# Patient Record
Sex: Female | Born: 1970 | Race: Black or African American | Hispanic: No | Marital: Single | Smoking: Former smoker
Health system: Southern US, Community
[De-identification: ages and names within clinical notes are randomized; demographics above are authoritative.]

## PROBLEM LIST (undated history)

## (undated) DIAGNOSIS — A4902 Methicillin resistant Staphylococcus aureus infection, unspecified site: Secondary | ICD-10-CM

## (undated) DIAGNOSIS — D571 Sickle-cell disease without crisis: Secondary | ICD-10-CM

## (undated) DIAGNOSIS — I10 Essential (primary) hypertension: Secondary | ICD-10-CM

## (undated) HISTORY — PX: ECTOPIC PREGNANCY SURGERY: SHX613

## (undated) HISTORY — DX: Sickle-cell disease without crisis: D57.1

## (undated) HISTORY — PX: COLONOSCOPY: SHX174

## (undated) HISTORY — PX: OTHER SURGICAL HISTORY: SHX169

## (undated) HISTORY — PX: EYE SURGERY: SHX253

---

## 2006-03-08 ENCOUNTER — Ambulatory Visit (HOSPITAL_COMMUNITY): Admission: RE | Admit: 2006-03-08 | Discharge: 2006-03-08 | Payer: Self-pay | Admitting: Gastroenterology

## 2007-08-23 ENCOUNTER — Emergency Department (HOSPITAL_COMMUNITY): Admission: EM | Admit: 2007-08-23 | Discharge: 2007-08-23 | Payer: Self-pay | Admitting: Emergency Medicine

## 2008-11-10 ENCOUNTER — Emergency Department (HOSPITAL_COMMUNITY): Admission: EM | Admit: 2008-11-10 | Discharge: 2008-11-10 | Payer: Self-pay | Admitting: Emergency Medicine

## 2008-12-22 ENCOUNTER — Emergency Department (HOSPITAL_COMMUNITY): Admission: EM | Admit: 2008-12-22 | Discharge: 2008-12-22 | Payer: Self-pay | Admitting: Emergency Medicine

## 2009-05-18 ENCOUNTER — Emergency Department (HOSPITAL_COMMUNITY): Admission: EM | Admit: 2009-05-18 | Discharge: 2009-05-18 | Payer: Self-pay | Admitting: Emergency Medicine

## 2010-06-09 LAB — URINALYSIS, ROUTINE W REFLEX MICROSCOPIC
Glucose, UA: NEGATIVE mg/dL
Nitrite: POSITIVE — AB
Protein, ur: NEGATIVE mg/dL
Specific Gravity, Urine: 1.005 (ref 1.005–1.030)
Urobilinogen, UA: 0.2 mg/dL (ref 0.0–1.0)
pH: 6 (ref 5.0–8.0)

## 2010-06-09 LAB — URINE MICROSCOPIC-ADD ON

## 2010-06-09 LAB — PREGNANCY, URINE: Preg Test, Ur: NEGATIVE

## 2010-06-24 LAB — URINALYSIS, ROUTINE W REFLEX MICROSCOPIC
Bilirubin Urine: NEGATIVE
Nitrite: NEGATIVE
Protein, ur: NEGATIVE mg/dL
pH: 6 (ref 5.0–8.0)

## 2010-06-24 LAB — RETICULOCYTES
RBC.: 4.12 MIL/uL (ref 3.87–5.11)
Retic Count, Absolute: 103 10*3/uL (ref 19.0–186.0)
Retic Ct Pct: 2.5 % (ref 0.4–3.1)

## 2010-06-24 LAB — CBC
HCT: 30.3 % — ABNORMAL LOW (ref 36.0–46.0)
Hemoglobin: 10.2 g/dL — ABNORMAL LOW (ref 12.0–15.0)
MCV: 72.7 fL — ABNORMAL LOW (ref 78.0–100.0)
Platelets: 242 10*3/uL (ref 150–400)

## 2010-06-24 LAB — PREGNANCY, URINE: Preg Test, Ur: NEGATIVE

## 2010-06-24 LAB — DIFFERENTIAL
Basophils Absolute: 0 10*3/uL (ref 0.0–0.1)
Basophils Relative: 1 % (ref 0–1)
Eosinophils Relative: 2 % (ref 0–5)
Lymphs Abs: 1.4 10*3/uL (ref 0.7–4.0)
Monocytes Absolute: 0.3 10*3/uL (ref 0.1–1.0)
Monocytes Relative: 4 % (ref 3–12)
Neutro Abs: 5.1 10*3/uL (ref 1.7–7.7)

## 2010-06-24 LAB — POCT CARDIAC MARKERS
Myoglobin, poc: 92.2 ng/mL (ref 12–200)
Troponin i, poc: <0.05 ng/mL (ref 0.00–0.09)

## 2010-06-24 LAB — URINE MICROSCOPIC-ADD ON

## 2010-08-06 NOTE — Op Note (Signed)
[name], [name]                 ACCOUNT NO.:  [id number]   MEDICAL RECORD NO.:  [id number]          PATIENT TYPE:  AMB   LOCATION:  ENDO                         FACILITY:  [facility]   PHYSICIAN:  Danise Edge, M.D.   DATE OF BIRTH:  03/25/1970   DATE OF PROCEDURE:  03/08/2006  DATE OF DISCHARGE:                               OPERATIVE REPORT   PROCEDURE:  Screening colonoscopy.   PROCEDURE INDICATIONS:  [name] is a 39 year old female born  [birth year].  [name] is undergoing diagnostic colonoscopy to  evaluate intermittent painless hematochezia.  She has chronic  hypertension and sickle cell disease.  [name]' sister developed  colon cancer at age 17.  Her mother and brother have undergone  colonoscopic exams to remove colon polyps.   ENDOSCOPIST:  Danise Edge, M.D.   PREMEDICATION:  Fentanyl 100 mcg, Versed 10 mg.   DESCRIPTION OF PROCEDURE:  After obtaining informed consent, Sherry George  was placed in the left lateral decubitus position.  I administered  intravenous fentanyl and intravenous Versed to achieve conscious  sedation for the procedure.  The patient's blood pressure, oxygen  saturation and cardiac rhythm were monitored throughout the procedure  and documented in the medical record.   Anal inspection and digital rectal exam were normal.  The Pentax  colonoscope was introduced into the rectum and advanced to the cecum  without difficulty.  Colonic preparation for the exam today was  satisfactory.   Rectum normal.  Retroflex view of the distal rectum reveals moderate  size non-bleeding internal hemorrhoids.  Sigmoid colon and descending colon normal.  Splenic flexure normal.  Transverse colon normal.  Hepatic flexure normal.  Descending colon normal.  Cecum and ileocecal valve normal.   ASSESSMENT:  Normal proctocolonoscopy to the cecum except for the  presence of moderate sized internal hemorrhoids which are probably the  etiology of Ms.  Sherry George' intermittent painless hematochezia.  There is no  endoscopic evidence of the presence of colorectal neoplasia or  inflammatory bowel disease.   RECOMMENDATIONS:  Repeat screening colonoscopy in December 2012 given  her strong family history of colon cancer and colon polyps.           ______________________________  Danise Edge, M.D.     MJ/MEDQ  D:  03/08/2006  T:  03/08/2006  Job:  [id number]   cc:   Lesle Chris, R.N.

## 2010-12-27 ENCOUNTER — Emergency Department (HOSPITAL_COMMUNITY): Payer: Self-pay

## 2010-12-27 ENCOUNTER — Emergency Department (HOSPITAL_COMMUNITY)
Admission: EM | Admit: 2010-12-27 | Discharge: 2010-12-27 | Disposition: A | Discharge status: Home / Self Care | Payer: Self-pay | Attending: Emergency Medicine | Admitting: Emergency Medicine

## 2010-12-27 DIAGNOSIS — R079 Chest pain, unspecified: Secondary | ICD-10-CM | POA: Insufficient documentation

## 2010-12-27 DIAGNOSIS — F17200 Nicotine dependence, unspecified, uncomplicated: Secondary | ICD-10-CM | POA: Insufficient documentation

## 2010-12-27 DIAGNOSIS — L0291 Cutaneous abscess, unspecified: Secondary | ICD-10-CM | POA: Insufficient documentation

## 2010-12-27 DIAGNOSIS — I10 Essential (primary) hypertension: Secondary | ICD-10-CM | POA: Insufficient documentation

## 2010-12-27 DIAGNOSIS — D571 Sickle-cell disease without crisis: Secondary | ICD-10-CM | POA: Insufficient documentation

## 2010-12-27 LAB — BASIC METABOLIC PANEL
BUN: 11 mg/dL (ref 6–23)
Chloride: 101 mEq/L (ref 96–112)
Creatinine, Ser: 0.69 mg/dL (ref 0.50–1.10)
Glucose, Bld: 97 mg/dL (ref 70–99)
Potassium: 3.9 mEq/L (ref 3.5–5.1)

## 2010-12-27 LAB — CBC
HCT: 33.1 % — ABNORMAL LOW (ref 36.0–46.0)
MCV: 74.7 fL — ABNORMAL LOW (ref 78.0–100.0)
Platelets: 180 10*3/uL (ref 150–400)
RBC: 4.43 MIL/uL (ref 3.87–5.11)

## 2010-12-27 LAB — DIFFERENTIAL
Eosinophils Absolute: 0.2 10*3/uL (ref 0.0–0.7)
Lymphs Abs: 1.8 10*3/uL (ref 0.7–4.0)
Monocytes Absolute: 0.4 10*3/uL (ref 0.1–1.0)
Monocytes Relative: 6 % (ref 3–12)
Neutrophils Relative %: 68 % (ref 43–77)

## 2010-12-27 MED ORDER — IOHEXOL 300 MG/ML  SOLN
100.0000 mL | Freq: Once | INTRAMUSCULAR | Status: AC | PRN
  Administered 2010-12-27: 100 mL via INTRAVENOUS

## 2011-01-03 ENCOUNTER — Inpatient Hospital Stay (HOSPITAL_COMMUNITY)
Admission: EM | Admit: 2011-01-03 | Discharge: 2011-01-11 | DRG: 602 | Disposition: A | Discharge status: Home / Self Care | Payer: Self-pay | Attending: Internal Medicine | Admitting: Internal Medicine

## 2011-01-03 DIAGNOSIS — T370X5A Adverse effect of sulfonamides, initial encounter: Secondary | ICD-10-CM | POA: Diagnosis present

## 2011-01-03 DIAGNOSIS — B957 Other staphylococcus as the cause of diseases classified elsewhere: Secondary | ICD-10-CM | POA: Diagnosis present

## 2011-01-03 DIAGNOSIS — F17200 Nicotine dependence, unspecified, uncomplicated: Secondary | ICD-10-CM | POA: Diagnosis present

## 2011-01-03 DIAGNOSIS — R799 Abnormal finding of blood chemistry, unspecified: Secondary | ICD-10-CM | POA: Diagnosis present

## 2011-01-03 DIAGNOSIS — D72829 Elevated white blood cell count, unspecified: Secondary | ICD-10-CM | POA: Diagnosis not present

## 2011-01-03 DIAGNOSIS — E871 Hypo-osmolality and hyponatremia: Secondary | ICD-10-CM | POA: Diagnosis present

## 2011-01-03 DIAGNOSIS — R5081 Fever presenting with conditions classified elsewhere: Secondary | ICD-10-CM | POA: Diagnosis present

## 2011-01-03 DIAGNOSIS — I10 Essential (primary) hypertension: Secondary | ICD-10-CM | POA: Diagnosis present

## 2011-01-03 DIAGNOSIS — J329 Chronic sinusitis, unspecified: Secondary | ICD-10-CM | POA: Diagnosis present

## 2011-01-03 DIAGNOSIS — R0789 Other chest pain: Secondary | ICD-10-CM | POA: Diagnosis present

## 2011-01-03 DIAGNOSIS — D5700 Hb-SS disease with crisis, unspecified: Secondary | ICD-10-CM | POA: Diagnosis present

## 2011-01-03 DIAGNOSIS — E876 Hypokalemia: Secondary | ICD-10-CM | POA: Diagnosis not present

## 2011-01-03 DIAGNOSIS — L02219 Cutaneous abscess of trunk, unspecified: Principal | ICD-10-CM | POA: Diagnosis present

## 2011-01-03 DIAGNOSIS — D6959 Other secondary thrombocytopenia: Secondary | ICD-10-CM | POA: Diagnosis present

## 2011-01-03 DIAGNOSIS — J189 Pneumonia, unspecified organism: Secondary | ICD-10-CM | POA: Diagnosis present

## 2011-01-03 LAB — COMPREHENSIVE METABOLIC PANEL
ALT: 21 U/L (ref 0–35)
AST: 72 U/L — ABNORMAL HIGH (ref 0–37)
Alkaline Phosphatase: 54 U/L (ref 39–117)
Calcium: 8.2 mg/dL — ABNORMAL LOW (ref 8.4–10.5)
Glucose, Bld: 102 mg/dL — ABNORMAL HIGH (ref 70–99)
Potassium: 3.4 mEq/L — ABNORMAL LOW (ref 3.5–5.1)
Sodium: 128 mEq/L — ABNORMAL LOW (ref 135–145)
Total Protein: 6.9 g/dL (ref 6.0–8.3)

## 2011-01-03 LAB — BASIC METABOLIC PANEL
BUN: 9 mg/dL (ref 6–23)
Calcium: 8.5 mg/dL (ref 8.4–10.5)
Chloride: 96 mEq/L (ref 96–112)
GFR calc Af Amer: 86 mL/min — ABNORMAL LOW (ref >90)
GFR calc non Af Amer: 74 mL/min — ABNORMAL LOW (ref >90)
Glucose, Bld: 93 mg/dL (ref 70–99)
Potassium: 3.8 mEq/L (ref 3.5–5.1)

## 2011-01-03 LAB — DIFFERENTIAL
Basophils Relative: 0 % (ref 0–1)
Eosinophils Absolute: 0.4 10*3/uL (ref 0.0–0.7)
Monocytes Relative: 4 % (ref 3–12)

## 2011-01-03 LAB — CBC
HCT: 27.9 % — ABNORMAL LOW (ref 36.0–46.0)
RBC: 3.77 MIL/uL — ABNORMAL LOW (ref 3.87–5.11)
RDW: 17.8 % — ABNORMAL HIGH (ref 11.5–15.5)
WBC: 4.8 10*3/uL (ref 4.0–10.5)

## 2011-01-04 ENCOUNTER — Inpatient Hospital Stay (HOSPITAL_COMMUNITY): Payer: Self-pay

## 2011-01-04 LAB — COMPREHENSIVE METABOLIC PANEL
ALT: 24 U/L (ref 0–35)
Alkaline Phosphatase: 56 U/L (ref 39–117)
CO2: 21 mEq/L (ref 19–32)
Chloride: 100 mEq/L (ref 96–112)
GFR calc Af Amer: >90 mL/min (ref >90)
Glucose, Bld: 97 mg/dL (ref 70–99)
Potassium: 4 mEq/L (ref 3.5–5.1)
Sodium: 133 mEq/L — ABNORMAL LOW (ref 135–145)
Total Bilirubin: 0.7 mg/dL (ref 0.3–1.2)
Total Protein: 7.2 g/dL (ref 6.0–8.3)

## 2011-01-04 LAB — CBC
HCT: 22.8 % — ABNORMAL LOW (ref 36.0–46.0)
Hemoglobin: 8.1 g/dL — ABNORMAL LOW (ref 12.0–15.0)
MCH: 26.7 pg (ref 26.0–34.0)
MCHC: 35.5 g/dL (ref 30.0–36.0)
MCV: 75.2 fL — ABNORMAL LOW (ref 78.0–100.0)
RDW: 17.7 % — ABNORMAL HIGH (ref 11.5–15.5)

## 2011-01-04 LAB — BASIC METABOLIC PANEL
BUN: 9 mg/dL (ref 6–23)
Calcium: 8.5 mg/dL (ref 8.4–10.5)
Creatinine, Ser: 0.82 mg/dL (ref 0.50–1.10)
GFR calc Af Amer: >90 mL/min (ref >90)
GFR calc non Af Amer: 88 mL/min — ABNORMAL LOW (ref >90)
Glucose, Bld: 91 mg/dL (ref 70–99)

## 2011-01-04 MED ORDER — IOHEXOL 300 MG/ML  SOLN
100.0000 mL | Freq: Once | INTRAMUSCULAR | Status: AC | PRN
  Administered 2011-01-04: 100 mL via INTRAVENOUS

## 2011-01-05 LAB — URINALYSIS, ROUTINE W REFLEX MICROSCOPIC
Glucose, UA: NEGATIVE mg/dL
Ketones, ur: NEGATIVE mg/dL
Leukocytes, UA: NEGATIVE
pH: 6 (ref 5.0–8.0)

## 2011-01-05 LAB — URINE MICROSCOPIC-ADD ON

## 2011-01-05 LAB — CBC
MCH: 27 pg (ref 26.0–34.0)
MCHC: 36 g/dL (ref 30.0–36.0)
Platelets: 61 10*3/uL — ABNORMAL LOW (ref 150–400)

## 2011-01-06 DIAGNOSIS — D5700 Hb-SS disease with crisis, unspecified: Secondary | ICD-10-CM

## 2011-01-06 DIAGNOSIS — R5081 Fever presenting with conditions classified elsewhere: Secondary | ICD-10-CM

## 2011-01-06 LAB — DIFFERENTIAL
Eosinophils Absolute: 0 10*3/uL (ref 0.0–0.7)
Eosinophils Relative: 0 % (ref 0–5)
Metamyelocytes Relative: 5 %
Monocytes Absolute: 1.4 10*3/uL — ABNORMAL HIGH (ref 0.1–1.0)
Monocytes Relative: 6 % (ref 3–12)
nRBC: 0 /100 WBC

## 2011-01-06 LAB — CBC
HCT: 19.1 % — ABNORMAL LOW (ref 36.0–46.0)
Hemoglobin: 7.4 g/dL — ABNORMAL LOW (ref 12.0–15.0)
Hemoglobin: 6.7 g/dL — CL (ref 12.0–15.0)
MCH: 26.3 pg (ref 26.0–34.0)
MCH: 26.8 pg (ref 26.0–34.0)
MCHC: 35.1 g/dL (ref 30.0–36.0)
MCV: 76.9 fL — ABNORMAL LOW (ref 78.0–100.0)
MCV: 76.4 fL — ABNORMAL LOW (ref 78.0–100.0)
RBC: 2.81 MIL/uL — ABNORMAL LOW (ref 3.87–5.11)

## 2011-01-06 LAB — COMPREHENSIVE METABOLIC PANEL
AST: 96 U/L — ABNORMAL HIGH (ref 0–37)
Albumin: 2.9 g/dL — ABNORMAL LOW (ref 3.5–5.2)
Alkaline Phosphatase: 43 U/L (ref 39–117)
Chloride: 102 mEq/L (ref 96–112)
Potassium: 3.5 mEq/L (ref 3.5–5.1)
Total Bilirubin: 1.1 mg/dL (ref 0.3–1.2)

## 2011-01-06 LAB — BASIC METABOLIC PANEL
CO2: 25 mEq/L (ref 19–32)
Chloride: 99 mEq/L (ref 96–112)
GFR calc non Af Amer: 78 mL/min — ABNORMAL LOW (ref >90)
Glucose, Bld: 120 mg/dL — ABNORMAL HIGH (ref 70–99)
Potassium: 3.6 mEq/L (ref 3.5–5.1)
Sodium: 132 mEq/L — ABNORMAL LOW (ref 135–145)

## 2011-01-06 LAB — CMV IGM: CMV IgM: 0.26 (ref <0.90)

## 2011-01-06 LAB — WOUND CULTURE

## 2011-01-07 LAB — PATHOLOGIST SMEAR REVIEW

## 2011-01-07 LAB — HEPARIN INDUCED THROMBOCYTOPENIA PNL
Heparin Induced Plt Ab: NEGATIVE
Patient O.D.: 0.324
UFH Low Dose 0.1 IU/mL: 0 % Release
UFH SRA Result: NEGATIVE

## 2011-01-07 LAB — DIFFERENTIAL
Basophils Relative: 0 % (ref 0–1)
Eosinophils Relative: 1 % (ref 0–5)
Lymphs Abs: 2.4 10*3/uL (ref 0.7–4.0)
Metamyelocytes Relative: 0 %
Monocytes Absolute: 0.7 10*3/uL (ref 0.1–1.0)
Myelocytes: 0 %
Neutro Abs: 15 10*3/uL — ABNORMAL HIGH (ref 1.7–7.7)
Neutrophils Relative %: 82 % — ABNORMAL HIGH (ref 43–77)

## 2011-01-07 LAB — CBC
MCHC: 34.9 g/dL (ref 30.0–36.0)
MCV: 77.4 fL — ABNORMAL LOW (ref 78.0–100.0)
Platelets: 106 10*3/uL — ABNORMAL LOW (ref 150–400)
RDW: 19.8 % — ABNORMAL HIGH (ref 11.5–15.5)
WBC: 18.3 10*3/uL — ABNORMAL HIGH (ref 4.0–10.5)

## 2011-01-07 LAB — RETICULOCYTES: Retic Count, Absolute: 272.2 10*3/uL — ABNORMAL HIGH (ref 19.0–186.0)

## 2011-01-07 LAB — PARVOVIRUS B19 ANTIBODY, IGG AND IGM: Parovirus B19 IgG Abs: 5.1 Index — ABNORMAL HIGH (ref <0.9)

## 2011-01-08 ENCOUNTER — Inpatient Hospital Stay (HOSPITAL_COMMUNITY): Payer: Self-pay

## 2011-01-08 LAB — COMPREHENSIVE METABOLIC PANEL
ALT: 13 U/L (ref 0–35)
Alkaline Phosphatase: 47 U/L (ref 39–117)
BUN: 5 mg/dL — ABNORMAL LOW (ref 6–23)
CO2: 26 mEq/L (ref 19–32)
Chloride: 102 mEq/L (ref 96–112)
GFR calc Af Amer: 82 mL/min — ABNORMAL LOW (ref >90)
Glucose, Bld: 105 mg/dL — ABNORMAL HIGH (ref 70–99)
Potassium: 3.3 mEq/L — ABNORMAL LOW (ref 3.5–5.1)
Sodium: 136 mEq/L (ref 135–145)
Total Bilirubin: 1.4 mg/dL — ABNORMAL HIGH (ref 0.3–1.2)

## 2011-01-08 LAB — HAPTOGLOBIN: Haptoglobin: 8 mg/dL — ABNORMAL LOW (ref 30–200)

## 2011-01-08 LAB — TYPE AND SCREEN: Antibody Screen: NEGATIVE

## 2011-01-08 LAB — CBC
HCT: 21.6 % — ABNORMAL LOW (ref 36.0–46.0)
Hemoglobin: 7.2 g/dL — ABNORMAL LOW (ref 12.0–15.0)
RBC: 2.71 MIL/uL — ABNORMAL LOW (ref 3.87–5.11)
WBC: 20.3 10*3/uL — ABNORMAL HIGH (ref 4.0–10.5)

## 2011-01-08 NOTE — Consult Note (Signed)
Sherry George, Sherry George NO.:  [id number]  MEDICAL RECORD NO.:  [id number]  LOCATION:  1341                         FACILITY:  [facility]  PHYSICIAN:  Judyann Munson, MD     DATE OF BIRTH:  [birth year]  DATE OF CONSULTATION: DATE OF DISCHARGE:                                CONSULTATION   REASON FOR CONSULTATION:  Fever and elevated white count despite broad- spectrum antibiotics.  HISTORY OF PRESENT ILLNESS:  Sherry George is a 40 year old African American female that has history of sickle cell disease.  She states that she has had 2 concurrent problems of chest pressure as well as had a chronic cellulitis to her left groin for roughly 3 weeks.  Her sickle cell disease has been relatively under good control, has not had any recent hospitalization.    She originally presented to the emergency room on October 4,  complaining of right axillary pain and flank pain.  At that time, her workup was benign and she was diagnosed with a sickle cell disease exacerbation and was sent home.  No antibiotics were given at that time.  Her chest x-ray did not show any infiltrates.  She returned back to the [facility] October 8th with history of 2 days of chest pressure. No productive cough.  No fevers at that time, but also still complained of some weeping of serous fluid of her left groin, possibly from an ingrown hair, for which she has been using topical antibiotic cream.  At that time, she did not have any leukocytosis nor fever.  They did repeat a chest x-ray at that time that did not show any infiltrates. she underwent a chest CT, which was negative for any pulmonary emboli.  She was given a 7 day course of Bactrim at that time to treat cellulitis.  2 days into her antibiotic course, she subsequently started noticing having nausea and vomiting associated with taking her antibiotic. She was feeling poorly and started having high fevers.  She presented to the emergency room again on  October 15th and at that time, she was noted to have a fever as high as 103.  She did have chills, nausea, and vomiting, and states that she had been taking her antibiotics as directed, only having 1 tablet left.  On admit,  she was not noted to have any rash, did have some flank pain, and has underwent an abdominal CT.  Her chest x-ray on October 16th within 24 hours of her admit, it is now showing left lower lobe airspace disease as well as retrocardiac infiltrate.  It is important to note that her white count is now 4.8, hematocrit of 27.9, hemoglobin of 10, which is decreased from a week prior where her hemoglobin was 11.7 and hematocrit of 33.1. In addition, her platelets at baseline were 180 and now dropped down to 66.  She was placed on vancomycin for cellulitis and then subsequently was placed on imipenem due to the finding infiltrates on her imaging concerning for pneumonia.  Since being admitted, her white count has been trending up quickly on October 16, it went from 5 on admit, increased to 19.6 on the 17th and  on the 18th up to 24.  No differential has been done.  Her hemoglobin has been trending down originally at 10, down to 7.4, hematocrit of 27.9, down to 21.  Her platelets have steadily increased up to 100.  She still has been having fevers of 101, however, has been taking Tylenol, so unclear her fever curve.  When asking the patient what she thinks is going on, she feels that this is all tied to the Bactrim where she felt that when she started taking the antibiotics within 2 days and got significantly worse.  She states that since being admitted, the cough medicine has been helping her somewhat.  The patient is not known to have any other illnesses other than hypertension associated with her sickle cell disease.  She does take folic acid and iron occasionally, otherwise has been in good fair of health.  Her infectious workup has not shown any bacteremia, has superficial  wound culture which showed a few coag-negative staphylococcus.  Her imaging is significant for patchy infiltrates on chest x-ray in the left lower lobe as well as retrocardiac space.  PAST MEDICAL HISTORY: 1. Sickle cell disease. 2. Hypertension. 3. History of tubal pregnancy, ectopic pregnancy, status post right     fallopian tube removal.  ALLERGIES:  MORPHINE and AMOXICILLIN causes yeast infections, and i would list  BACTRIM as an allergy for the patient since it could be causing BM suppression +/- hemolytic anemia  MEDICATION: 1. Vancomycin. 2. Imipenem. 3. Tylenol p.r.n. 4. Robitussin.  SOCIAL HISTORY:  Patient is single, is a single parent to 1 daughter. She does have a partner that she lives with and he is present during this interview.  She is currently unemployed.  Does smoke about 6 cigarettes per day and does drink socially.  No illicit drug use.  FAMILY HISTORY:  Significant for coronary artery disease, heart disease, sickle cell disease, diabetes, and hypertension.  REVIEW OF SYSTEMS:  She describes to have feeling poorly and having fevers and chills.  No night sweats.  Does have chest pressure with occasional cough, nonproductive.  No wheezes.  No chest pain or palpitations.  No acid reflux.  Abdominal distention.  She has right bilateral low back pain.  No flank pain.  No dysuria.  She does have tenderness in her left groin where has area of cellulitis and then no arthralgias or myalgias.  No headache or difficulty with vision.  12 point review of systems is otherwise negative.  PHYSICAL EXAMINATION:  VITAL SIGNS:  Her T-max is 101.6, currently at 98.3, pulse of 84, blood pressure 117/78, respiration rate 24, and 97% on room air. GENERAL:  This is a slightly ill-appearing female.  She is on the edge of the bed leaning over a trash can, feeling nauseous, but she is able to answer questions appropriately. HEENT:  Normocephalic, atraumatic.  PERRLA, EOMI.  No  scleral icterus. Pale conjunctivae.  Oropharynx is clear.  No signs of thrush or erythema. NECK:  Supple.  No lymphadenopathy.  No JVD.  No thyromegaly. PULMONARY EXAM:  Clear to auscultation bilaterally.  No wheezes, crackles, or rhonchi.  No egophony.  No accessory muscle use.  CARDIAC: Normal S1, S2.  No gallops, murmurs, or rubs. ABDOMEN:  Positive bowel sounds.  Nontender and nondistended.  No hepatosplenomegaly.  No flank pain.  Does have mild tenderness to deep palpation at the SI joints. EXTREMITIES:  Trace edema.  No clubbing or cyanosis. NEUROLOGIC:  Cranial nerves II through XII are grossly intact.  5/5 motor, sensation is intact.  LABORATORY DATA:  Her white count is 24, hemoglobin is 7.4, hematocrit 21.6, platelets of 101.  This is down from her counts back on the 8th, which were white count of 7.6, hemoglobin of 11.7, hematocrit of 32.1, and platelets of 180.  Her basic metabolic panel shows a creatinine of 0.9, BUN of 6.  C diff is negative.  She does have a positive fecal lactoferrin.  UA shows a large amount of blood, negative nitrites, negative leuk esterase.  LDH is 1209.  Her AST is elevated at 175 and ALT of 24.  HIV is negative.  MICROBIOLOGY:   1)  2 sets of blood cultures on October 15, no growth to date.   2) Blood culture on October 16, no growth to date. 3) Wound culture on October 15, shows a few coag-negative staph.  RADIOLOGY: 1. Chest x-ray on October 16 shows consolidation of left lower lobe,     best seen on the CT abdomen and pelvis, atelectasis or infiltrate     in the right lung base as well.  No effusion. 2. Abdominal and pelvis CT shows multifocal infiltrates in the lung     base, mostly in the left lower lobe.  The spleen is enlarged,     length of 17 cm.  Large and small bowel are unremarkable.  No free     fluid in the pelvis. 3. Old imaging.  Chest CT on October 8 shows no pulmonary emboli, no     acute findings.  Chest x-ray on October 8  shows stable chronic     bilateral atelectasis, no acute poor cardiopulmonary process.  ASSESSMENT AND PLAN:  This is a 40 year old female with sickle cell disease and has had chronic weeping of the left groin lesion/cellulitis for which she has been using topical antibiotics, presented initially back on October 4 with mild sickle cell exacerbation and returned on October 8 for worsening constitutional symptoms.  She had worsening of chest pain as well as concern for ongoing cellulitis.  She was given a 7 day course of Bactrim, for which she now is admitted with having high fevers, new thrombocytopenia and chest x-rays with air space disease. She was started on vancomycin and imipenem for cellulitis, and presumed pneumonia, and still remains to be febrile, increasing leukocytosis despite antibiotics, worsening anemia and evidence of hemolytic anemia by elevated LDH.  This is likely a combination of the patient having acute sickle crisis with acute chest syndrome.  The trigger to her sickle crisis is not completely clear but it is likely coIt has compounded by having hemolytic anemia and at the very least BM suppression related to Bactrim exposure.  She is not previously diagnosed as  having a G6PD deficiency, however, this may explain that Bactrim is triggering her hemolytic anemia versus possibly a viral illness triggering this episode.  She has been having erratically high fevers of 101.6-103 and with leukocytosis, which can also be explained as a drug fever related recent exposure to Bactrim. Her fever curve is somewhat trending down, however, it is difficult to tell given her Tylenol exposure.  Overall, I feel her process is sequelae of her sickle cell crisis and less likely infectious.  RECOMMENDATION: 1. For sickle cell crisis, we will ask to check for G6PD deficiency     workup, hemolysis labs and peripheral blood smears to ensure     that this is consistent with a sickle cell  crisis and can compare  to her blood specimens from a few days ago, since it appears that     her H and H continues to trend down. can restart home doses of folic      acid and iron sulfate since patient would not like to take any blood      products at this time. 2. For the mild cellulitis, recommend to stop vancomycin today since     the patient has roughly finished a 10-day course of therapy if you     include the 7 days of Bactrim she had received prior to being     admitted.  She is not MRSA colonized and nor was there any MRSA     isolated in her wound culture and thus I would not need at this     antibiotic and on physical exam, erythema is improved.  This has a     small amount of serous drainage. 3. Chest infiltrates.  This is likely an acute chest syndrome in a     sickle cell patient with an exacerbation likely triggered by the     recent events and exposure to Bactrim vs. viral illness.  The patient      could be having a secondary process of a superimposed pneumonia, where     mycoplasma is thought to be more common. we would recommend to change her     regimen to switch to CAP coverage to inc. ceftriaxone or cefuroxime plus azithromycin;     this is not likely to be HCAP since the patient has not had any frequent     hospitalizations over the past 2 years. 4. In regard to her amoxicillin allergy, the patient states that she     is intolerant because she gets yeast infections. 5. Drug fever vs. presumed G6PD deficiency: although it is unclear if onewould see this amount of     leukocytosis associated with a G6PD deficiency exacerbation.  I     felt that this probably could be explained by her Bactrim exposure     that is leading to having a drug fever of high fevers, a component of BM suppression initially on admit,     leukocytosis, and subsequently causing her hemolytic anemia.     Follow up with the G6PD deficiency workup. Her CBC is improving since the discontinuation  of bactrim.  It has been a pleasure to see [name].    Greater than 45 minutes has been spent interviewing the patient, speaking with radiology and reviewing her old records.          ______________________________ Judyann Munson, MD    CS/MEDQ  D:  01/07/2011  T:  01/07/2011  Job:  [id number]  Electronically Signed by Judyann Munson MD on 01/08/2011 01:27:58 AM

## 2011-01-09 DIAGNOSIS — R072 Precordial pain: Secondary | ICD-10-CM

## 2011-01-09 LAB — BASIC METABOLIC PANEL
BUN: 5 mg/dL — ABNORMAL LOW (ref 6–23)
CO2: 27 mEq/L (ref 19–32)
Calcium: 8.5 mg/dL (ref 8.4–10.5)
Chloride: 103 mEq/L (ref 96–112)
Creatinine, Ser: 0.92 mg/dL (ref 0.50–1.10)

## 2011-01-09 LAB — CBC
HCT: 21.9 % — ABNORMAL LOW (ref 36.0–46.0)
MCH: 27 pg (ref 26.0–34.0)
MCV: 79.9 fL (ref 78.0–100.0)
Platelets: 200 10*3/uL (ref 150–400)
RBC: 2.74 MIL/uL — ABNORMAL LOW (ref 3.87–5.11)
RDW: 23.7 % — ABNORMAL HIGH (ref 11.5–15.5)
WBC: 21.8 10*3/uL — ABNORMAL HIGH (ref 4.0–10.5)

## 2011-01-10 ENCOUNTER — Inpatient Hospital Stay (HOSPITAL_COMMUNITY): Payer: Self-pay

## 2011-01-10 LAB — CULTURE, BLOOD (ROUTINE X 2)
Culture  Setup Time: 201210160218
Culture: NO GROWTH

## 2011-01-10 LAB — BASIC METABOLIC PANEL
BUN: 7 mg/dL (ref 6–23)
Calcium: 8.7 mg/dL (ref 8.4–10.5)
Creatinine, Ser: 0.91 mg/dL (ref 0.50–1.10)
GFR calc Af Amer: 90 mL/min — ABNORMAL LOW (ref >90)
GFR calc non Af Amer: 77 mL/min — ABNORMAL LOW (ref >90)

## 2011-01-10 LAB — CBC
HCT: 24.6 % — ABNORMAL LOW (ref 36.0–46.0)
MCHC: 32.5 g/dL (ref 30.0–36.0)
MCV: 82.3 fL (ref 78.0–100.0)
Platelets: 270 10*3/uL (ref 150–400)
RDW: 24.2 % — ABNORMAL HIGH (ref 11.5–15.5)
WBC: 18.1 10*3/uL — ABNORMAL HIGH (ref 4.0–10.5)

## 2011-01-11 ENCOUNTER — Inpatient Hospital Stay (HOSPITAL_COMMUNITY): Payer: Self-pay

## 2011-01-11 LAB — COMPREHENSIVE METABOLIC PANEL
ALT: 11 U/L (ref 0–35)
AST: 25 U/L (ref 0–37)
Albumin: 2.7 g/dL — ABNORMAL LOW (ref 3.5–5.2)
Alkaline Phosphatase: 55 U/L (ref 39–117)
BUN: 6 mg/dL (ref 6–23)
Chloride: 102 mEq/L (ref 96–112)
Potassium: 3.7 mEq/L (ref 3.5–5.1)
Sodium: 138 mEq/L (ref 135–145)
Total Bilirubin: 1.3 mg/dL — ABNORMAL HIGH (ref 0.3–1.2)
Total Protein: 6.3 g/dL (ref 6.0–8.3)

## 2011-01-11 LAB — CBC
MCHC: 33.2 g/dL (ref 30.0–36.0)
Platelets: 260 10*3/uL (ref 150–400)
RDW: 24.5 % — ABNORMAL HIGH (ref 11.5–15.5)
WBC: 15.5 10*3/uL — ABNORMAL HIGH (ref 4.0–10.5)

## 2011-01-11 LAB — HEMOGLOBINOPATHY EVALUATION
Hgb A2 Quant: 3.5 % — ABNORMAL HIGH (ref 2.2–3.2)
Hgb F Quant: 3.4 % — ABNORMAL HIGH (ref 0.0–2.0)
Hgb S Quant: 48.6 % — ABNORMAL HIGH (ref 0.0–0.0)

## 2011-01-11 LAB — GLUCOSE 6 PHOSPHATE DEHYDROGENASE: G-6-PD, Quant: 27.9 U/GM HGB — ABNORMAL HIGH (ref 7.0–20.5)

## 2011-01-14 LAB — HGB ELECTROPHORESIS REFLEXED REPORT
Hemoglobin A2 - HGBRFX: 4.4 % — ABNORMAL HIGH (ref 1.8–3.5)
Hemoglobin F - HGBRFX: 2.6 % — ABNORMAL HIGH (ref <2.0)
Hemoglobin S - HGBRFX: 48.6 % — ABNORMAL HIGH
Sickle Solubility Test - HGBRFX: POSITIVE — AB

## 2011-01-16 NOTE — Discharge Summary (Signed)
Sherry George, Sherry George                 ACCOUNT NO.:  [id number]  MEDICAL RECORD NO.:  [id number]  LOCATION:  1341                         FACILITY:  [facility]  PHYSICIAN:  Kela Millin, M.D.DATE OF BIRTH:  June 23, 1970  DATE OF ADMISSION:  01/03/2011 DATE OF DISCHARGE:  01/11/2011                        DISCHARGE SUMMARY - REFERRING   DISCHARGE DIAGNOSES: 1. Bactrim adverse reaction/allergy - Resolving.  The patient's G6PD     level is 27.9, she is not a G6PD deficient. 2. Pneumonia with associated sickle cell lung disease/? acute chest     syndrome. 3. Thrombocytopenia - Secondary to Bactrim myelosuppression, resolved. 4. Normocytic anemia - Hemoglobin stable today at 8.2 prior to     discharge. 5. Sickle cell disease, hemoglobin [place]. 6. Elevated proBNP - Possibly due to sickle lung disease per Dr. August Saucer,     no evidence of fluid overload. 7. Drug fever - Secondary to Bactrim, resolved. 8. Leukocytosis - Improved,? nucleated cells cueing the count. 9. History of hypertension. 10.Tobacco abuse.  PROCEDURES AND STUDIES: 1. CT scan of abdomen and pelvis on October 16th - Multifocal     pneumonia in the lung bases, most confluent in the left lower lobe. 2. Chest x-ray on October 16th - Left lower lobe pneumonia.  Right     basilar atelectasis or infiltrate. 3. Chest x-ray on October 20th - Unchanged left lower lobe     consolidation/atelectasis.  Pneumonia not excluded. 4. Limited CT scan of the maxillofacial - Normal paranasal sinuses. 5. A 2-D echocardiogram on October 21st:  Ejection fraction 65% to     70%.  Systolic function vigorous.  CONSULTATIONS: 1. Infectious Disease - Dr. Ilsa Iha. 2. [facility] - Dr. August Saucer.  BRIEF HISTORY:  The patient is a pleasant 40 year old black female with a history of Morton disease, who presented with pain and swelling in the left groin as well as fever.  It was noted that she had been seen in the ED previously and was started on Bactrim and  discharged home, but she was unable to tolerate the Bactrim and continued to have the fever and so, she came back to the ED.  Please see the full admission history and physical dictated on January 03, 2011 for the details of the admission physical exam as well as the laboratory data.  HOSPITAL COURSE: 1. Left groin cellulitis - Upon admission, the patient was placed on     broad-spectrum antibiotics with vancomycin and Primaxin.  Blood     cultures were obtained.  The blood cultures did not grew any     bacteria.  The patient continued to spike fever and so Infectious     Disease was consulted and Dr. Ilsa Iha saw the patient and her     impression was that she was having drug fever secondary to the     Bactrim as it usually takes some time to completely get out of her     system.  The patient received the full course of antibiotics for     cellulitis with the vancomycin and it was discontinued.  The     suspicion was also that the patient might have had a G6PD  deficiency and if so, Primaxin would be contraindicated and so, thePrimaxin was discontinued at that time and a G6PD level was done.     With this interventions, the patient defervesced and she has not     had any more drainage or pain in her left groin, and the cellulitis     has resolved and she is remaining afebrile and would not require     any further antibiotics upon discharge. 2. Thrombocytopenia - The patient was noted to be thrombocytopenic on     admission and the Bactrim was discontinued and the impression is     that it was the cause of the thrombocytopenia.  Her platelet count     today prior to discharge is 260.  She did not have any gross     bleeding while in the hospital. 3. Pneumonia with associated sickle lung disease - While in the     hospital, the patient was complaining of cough and pleuritic pain.     She was also having fever with associated leukocytosis.  A CT scan     of her abdomen and pelvis had  shown the lower lungs with findings     consistent with multifocal pneumonia in the lung bases and that it     was most confluent in the left lower lobe.  A chest x-ray was done     and showed findings consistent with left lower lobe pneumonia.  The     patient was started on empiric antibiotics for the pneumonia with     Rocephin and Zithromax.  The patient does have sickle cell and so,     the concern was that this could be acute chest syndrome.  I     consulted Dr. August Saucer with the sickle cell center and he saw the     patient.  Her LDH was elevated at 1505, although her bilirubin was     only slightly elevated at 1.4, and she was noted to be anemic.  Dr.     August Saucer agreed with antibiotic coverage and supportive management of     probable associated sickle lung disease.  With this interventions,     she is significantly improved at this time.  She has been afebrile     for the past 48 hours and her p.o. intake is improved as well as     her cough.  She has completed full antibiotic course of pneumonia     and will not require any further antibiotics upon discharge. 4. Leukocytosis - Upon admission, the patient had a normal white cell     count, but subsequently on antibiotics in the hospital.  The white     cell count increased to a high of 24.1.  Infectious Disease was     consulted and Dr. Chaya Jan impression was that it was likely not     infection, but secondary to stress.  As she has been clinically     improving, her leukocytosis has improved down to 15.5 today.  She     is afebrile and hemodynamically stable.  She will be discharged for     outpatient followup at this time. 5. Elevated pro-brain natriuretic peptide - A 2D echocardiogram was     done to further evaluate and the results as stated above with     vigorous systolic function noted.  Dr. August Saucer followed up on the     patient and his impression was that it was probably due  to sickle     lung disease. 6. Sickle cell disease -  The patient did not have any pain consistent     with the pain crisis during this hospital stay.  Her hemoglobin     dropped to a low of 6.7 during this hospital stay on January 06, 2011, but she refused any blood transfusions.  She was placed on     supplemental iron and folic acid and her hemoglobin improved to 8.2     today.  She had had stool guaiacs done at that time and that came     back positive and the impression was that it was just contaminated.     The myelosuppression from her Bactrim and her sickle cell were     thought to be the etiology of the anemia.  She is to follow up     outpatient with Dr. August Saucer and she states she will do so once she can     have her insurance set up. 7. History of hypertension - The patient is on no medications and her     blood pressures were mostly in the 130s while in the hospital.  She     is to follow up at [facility] for further monitoring of her blood     pressures and for antihypertensive to be started as clinically     appropriate. 8. Tobacco abuse - The patient was counseled to quit.  DISCHARGE MEDICATIONS: 1. Tessalon Perles 100 mg p.o. t.i.d. p.r.n. 2. Iron sulfate 325 mg p.o. daily. 3. Folic acid 1 mg p.o. daily. 4. Robitussin DM 10 cc q.4 h. p.r.n. 5. Claritin 10 mg p.o. daily. 6. Tylenol 650 mg p.o. q.4 h. to 6 h. p.r.n. 7. Diphenhydramine 50 mg p.o. q.h.s. 8. Multivitamins 1 p.o. daily. 9. Neosporin apply to groin area daily as previously.  DISCONTINUED MEDICATIONS:  Bactrim.  SPECIAL INSTRUCTIONS:  Please note that the patient has a BACTRIM allergy.  FOLLOWUP CARE: 1. [facility] - Dr. Andrey Campanile, November 30th at 3 p.m. 2. Dr. August Saucer, the patient to call for appointment.  DISCHARGE CONDITION:  Improved/stable.     Kela Millin, M.D.     ACV/MEDQ  D:  01/11/2011  T:  01/11/2011  Job:  [id number]  cc:   Minerva Areola L. August Saucer, M.D. [address] [place], [place] [address] [phone]  Dr.  Andrey Campanile, [facility].  Electronically Signed by Donnalee Curry M.D. on 01/16/2011 03:28:09 PM

## 2011-01-19 NOTE — H&P (Signed)
Sherry George, Sherry George                 ACCOUNT NO.:  [id number]  MEDICAL RECORD NO.:  [id number]  LOCATION:  WLED                         FACILITY:  [facility]  PHYSICIAN:  Ladell Pier, M.D.   DATE OF BIRTH:  01/20/1971  DATE OF ADMISSION:  01/03/2011 DATE OF DISCHARGE:                             HISTORY & PHYSICAL   CHIEF COMPLAINT:  Pain and swelling in the left groin area with fevers.  HISTORY OF PRESENT ILLNESS:  The patient is a 40 year old African American female, past medical history significant for [place] disease, tobacco use, hypertension.  The patient presented to the emergency room a week ago complaining of chest pain and groin pain.  She had a CT of the chest done that was negative for PE.  She was placed on Bactrim for groin cellulitis and sent home.  She stated that since she has been home, the tenderness and pain in the left groin area has improved, but there is still some drainage.  She could not tolerate the Bactrim any more and she continued to have fever, so she presented back to the emergency room.  She complained of chills, also night sweats.  She states that she has 1 pill of the Bactrim, but she would just could not take that any more and she does not think the groin is totally healed. She does not have any other complaints.  PAST MEDICAL HISTORY:  Significant for hypertension that is diet controlled, and [place] disease.  PAST SURGICAL HISTORY:  Ectopic pregnancy with right tube removal.  FAMILY HISTORY:  Father died at 45 from cardiovascular disease.  Mother 84, she has heart disease, diabetes, hypertension.  SOCIAL HISTORY:  Patient is single with 1 child.  She is unemployed. She drinks socially.  She smokes about 6 cigarettes a day.  MEDICATIONS:  She has been taking the Bactrim Double Strength and Benadryl p.r.n., multivitamin a day, she has been using Neosporin topically, Benadryl 50 at bedtime, Tylenol p.r.n., ibuprofen p.r.n.  ALLERGIES:  To AMOXICILLIN  and MORPHINE.  REVIEW OF SYSTEMS:  Negative, otherwise stated in HPI.  PHYSICAL EXAMINATION:  VITAL SIGNS: Temperature 103.1 max, respirations 18, blood pressure 124/86, pulse ox 100% on room air. GENERAL:  The patient is laying on the stretcher well-nourished African American female. HEENT:  Normocephalic, atraumatic.  Pupils reactive to light.  Throat without erythema. CARDIOVASCULAR:  Regular rate and rhythm. LUNGS:  Were clear bilaterally. ABDOMEN:  Positive bowel sounds. EXTREMITIES:  Without edema.  In the left groin area, she does have mild erythema with some purulent drainage with deep pressing in that groin area she does have minimal drainage of yellow purulent material.  LABORATORIES:  WBC 4.8, hemoglobin 10.1, MCV 74, platelets of 66. Sodium 128, potassium 3.8, chloride 96, CO2 of 22, glucose 93, BUN 9, creatinine 0.94, calcium 8.5.  ASSESSMENT: 1. Cellulitis of the groin. 2. Fever. 3. Hypertension. 4. Hyponatremia. 5. Thrombocytopenia.  PLAN: 1. We will admit the patient to the hospital, put her on vancomycin     for the cellulitis, we will get blood cultures and the ER already     had a sample from the groin area.  We will put Bactroban  ointment     on it and low platelets, this could be secondary to the Bactrim     that she was on for the cellulitis.  Since the platelet count on     the 8th when she was here with 180, so we will monitor that for     now.  If she continues to spike fevers despite the antibiotic     therapy, then maybe a CT of the pelvis to rule out deep abscess or     ortho consult would be warranted.  We will defer to the rounding     physician. Time spent with the patient and doing this admission is approximately 45 minutes.     Ladell Pier, M.D.     [place]/MEDQ  D:  01/03/2011  T:  01/03/2011  Job:  [id number]  Electronically Signed by Ladell Pier M.D. on 01/19/2011 09:55:49 PM

## 2011-02-14 ENCOUNTER — Emergency Department (HOSPITAL_COMMUNITY)
Admission: EM | Admit: 2011-02-14 | Discharge: 2011-02-14 | Disposition: A | Discharge status: Home / Self Care | Payer: Self-pay | Attending: Emergency Medicine | Admitting: Emergency Medicine

## 2011-02-14 ENCOUNTER — Encounter: Payer: Self-pay | Admitting: Emergency Medicine

## 2011-02-14 DIAGNOSIS — M542 Cervicalgia: Secondary | ICD-10-CM | POA: Insufficient documentation

## 2011-02-14 DIAGNOSIS — I10 Essential (primary) hypertension: Secondary | ICD-10-CM | POA: Insufficient documentation

## 2011-02-14 DIAGNOSIS — R42 Dizziness and giddiness: Secondary | ICD-10-CM | POA: Insufficient documentation

## 2011-02-14 DIAGNOSIS — F411 Generalized anxiety disorder: Secondary | ICD-10-CM | POA: Insufficient documentation

## 2011-02-14 DIAGNOSIS — R51 Headache: Secondary | ICD-10-CM | POA: Insufficient documentation

## 2011-02-14 DIAGNOSIS — D571 Sickle-cell disease without crisis: Secondary | ICD-10-CM | POA: Insufficient documentation

## 2011-02-14 HISTORY — DX: Sickle-cell disease without crisis: D57.1

## 2011-02-14 HISTORY — DX: Essential (primary) hypertension: I10

## 2011-02-14 MED ORDER — HYDROCHLOROTHIAZIDE 12.5 MG PO TABS: 12.5000 mg | ORAL_TABLET | Freq: Every day | ORAL | Status: DC

## 2011-02-14 MED ORDER — FLUCONAZOLE 150 MG PO TABS
150.0000 mg | ORAL_TABLET | Freq: Once | ORAL | Status: AC
Start: 2011-02-14 — End: 2011-02-14
  Administered 2011-02-14: 150 mg via ORAL
  Filled 2011-02-14: qty 1

## 2011-02-14 NOTE — ED Provider Notes (Signed)
History     CSN: [id number] Arrival date & time: 02/14/2011  2:39 PM   First MD Initiated Contact with Patient 02/14/11 1456      Chief Complaint  Patient presents with  . Hypertension    Pt reports neck pain, headache and hypertention    (Consider location/radiation/quality/duration/timing/severity/associated sxs/prior treatment) HPI History is obtained from the patient. She states that she was recently hospitalized for sickle cell flare. She states that since then, she has had some intermittent headaches and she's felt as if her blood pressure was elevated. She states that she was on HCTZ at one point in the past, but has not been on it for several years. She took her blood pressure twice this week at the drugstore and it was elevated to 180 systolic.   She states that she has a generalized headache, but denies visual changes, photophobia, phonophobia, nausea, vomiting. Occasionally, she has a pain that goes into her neck with this. She has not had any dizziness or weakness or trouble walking. She has not taken anything to improve the headache. There are no known aggravating factors. She describes the headache as a generalized squeezing.  She has an appointment coming up with [facility] to "get established" for primary care in two weeks.  Past Medical History  Diagnosis Date  . Sickle cell anemia   . Hypertension     Past Surgical History  Procedure Date  . Ectopic pregnancy surgery   . Eye surgery     No family history on file.  History  Substance Use Topics  . Smoking status: Current Everyday Smoker  . Smokeless tobacco: Not on file  . Alcohol Use: 1.2 oz/week    1 Glasses of wine, 1 Cans of beer per week    OB History    Grav Para Term Preterm Abortions TAB SAB Ect Mult Living                  Review of Systems  Constitutional: Negative for fever, chills, diaphoresis and appetite change.  HENT: Positive for neck pain. Negative for ear pain, congestion, sore  throat, facial swelling, rhinorrhea, trouble swallowing and neck stiffness.   Eyes: Negative for photophobia, pain, discharge and visual disturbance.  Respiratory: Negative for shortness of breath and wheezing.   Cardiovascular: Negative for chest pain and palpitations.  Gastrointestinal: Negative for nausea, vomiting and abdominal pain.  Musculoskeletal: Negative for myalgias.  Neurological: Positive for light-headedness and headaches. Negative for dizziness, seizures, facial asymmetry, speech difficulty, weakness and numbness.  Psychiatric/Behavioral: Negative for confusion.    Allergies  Bactrim; Morphine and related; and Penicillins  Home Medications   Current Outpatient Rx  Name Route Sig Dispense Refill  . FERROUS SULFATE 325 (65 FE) MG PO TABS Oral Take 325 mg by mouth daily with breakfast.      . FOLIC ACID 1 MG PO TABS Oral Take 1 mg by mouth daily.        BP 160/99  Pulse 82  Temp(Src) 97.7 F (36.5 C) (Oral)  Resp 16  SpO2 100%  LMP 01/20/2011  Physical Exam  Nursing note and vitals reviewed. Constitutional: She is oriented to person, place, and time. She appears well-developed and well-nourished.  HENT:  Head: Normocephalic and atraumatic.  Right Ear: External ear normal.  Left Ear: External ear normal.  Nose: Nose normal.  Mouth/Throat: Oropharynx is clear and moist.  Eyes: Conjunctivae and EOM are normal. Pupils are equal, round, and reactive to light. Right eye exhibits no  discharge. Left eye exhibits no discharge.  Neck: Normal range of motion. Neck supple.       No carotid bruit  Cardiovascular: Normal rate, regular rhythm and normal heart sounds.   Pulmonary/Chest: Effort normal and breath sounds normal. She exhibits no tenderness.  Abdominal: Soft. There is no tenderness.  Musculoskeletal: Normal range of motion.  Lymphadenopathy:    She has no cervical adenopathy.  Neurological: She is alert and oriented to person, place, and time. No cranial nerve  deficit. She exhibits normal muscle tone. Coordination normal.  Skin: Skin is warm and dry. No rash noted.  Psychiatric: Her mood appears anxious.    ED Course  Procedures (including critical care time)  Labs Reviewed - No data to display No results found.   1. Hypertension       MDM  Patient appears nontoxic today; she does, however, seem quite anxious about her sx. Blood pressure is elevated in the ED to 182/110 on initial vitals. Neuro exam is nonfocal. Suspect these sx are likely stemming from elevated BP. Will start the patient back on her HCTZ; she states she was on 12.5 in the past and tolerated this well. She has an appointment coming up with [facility] in about 2 weeks, and was instructed to keep this appointment. She is instructed to keep track of her blood pressures at home. We discussed reasons to return to the ED. She verbalized understanding and agreed to plan.        Sangeeta Youse, [place] 02/15/11 [phone]

## 2011-02-15 NOTE — ED Provider Notes (Signed)
Medical screening examination/treatment/procedure(s) were performed by non-physician practitioner and as supervising physician I was immediately available for consultation/collaboration.  Doug Sou, MD 02/15/11 [phone]

## 2011-08-02 ENCOUNTER — Encounter (INDEPENDENT_AMBULATORY_CARE_PROVIDER_SITE_OTHER): Payer: Self-pay

## 2011-08-22 ENCOUNTER — Ambulatory Visit (INDEPENDENT_AMBULATORY_CARE_PROVIDER_SITE_OTHER): Payer: Self-pay | Admitting: General Surgery

## 2012-03-05 ENCOUNTER — Ambulatory Visit (INDEPENDENT_AMBULATORY_CARE_PROVIDER_SITE_OTHER): Payer: Self-pay | Admitting: Surgery

## 2012-03-26 ENCOUNTER — Encounter (INDEPENDENT_AMBULATORY_CARE_PROVIDER_SITE_OTHER): Payer: Self-pay | Admitting: Surgery

## 2012-03-26 ENCOUNTER — Ambulatory Visit (INDEPENDENT_AMBULATORY_CARE_PROVIDER_SITE_OTHER): Payer: PRIVATE HEALTH INSURANCE | Admitting: Surgery

## 2012-03-26 VITALS — BP 124/80 | HR 72 | Temp 99.0°F | Resp 18 | Ht 67.0 in | Wt 218.2 lb

## 2012-03-26 DIAGNOSIS — K649 Unspecified hemorrhoids: Secondary | ICD-10-CM

## 2012-03-26 NOTE — Patient Instructions (Signed)
High-Fiber Diet Fiber is found in fruits, vegetables, and grains. A high-fiber diet encourages the addition of more whole grains, legumes, fruits, and vegetables in your diet. The recommended amount of fiber for adult males is 38 g per day. For adult females, it is 25 g per day. Pregnant and lactating women should get 28 g of fiber per day. If you have a digestive or bowel problem, ask your caregiver for advice before adding high-fiber foods to your diet. Eat a variety of high-fiber foods instead of only a select few type of foods.  PURPOSE To increase stool bulk. To make bowel movements more regular to prevent constipation. To lower cholesterol. To prevent overeating. WHEN IS THIS DIET USED? It may be used if you have constipation and hemorrhoids. It may be used if you have uncomplicated diverticulosis (intestine condition) and irritable bowel syndrome. It may be used if you need help with weight management. It may be used if you want to add it to your diet as a protective measure against atherosclerosis, diabetes, and cancer. SOURCES OF FIBER Whole-grain breads and cereals. Fruits, such as apples, oranges, bananas, berries, prunes, and pears. Vegetables, such as green peas, carrots, sweet potatoes, beets, broccoli, cabbage, spinach, and artichokes. Legumes, such split peas, soy, lentils. Almonds. FIBER CONTENT IN FOODS Starches and Grains / Dietary Fiber (g) Cheerios, 1 cup / 3 g Corn Flakes cereal, 1 cup / 0.7 g Rice crispy treat cereal, 1 cup / 0.3 g Instant oatmeal (cooked),  cup / 2 g Frosted wheat cereal, 1 cup / 5.1 g Brown, long-grain rice (cooked), 1 cup / 3.5 g White, long-grain rice (cooked), 1 cup / 0.6 g Enriched macaroni (cooked), 1 cup / 2.5 g Legumes / Dietary Fiber (g) Baked beans (canned, plain, or vegetarian),  cup / 5.2 g Kidney beans (canned),  cup / 6.8 g Pinto beans (cooked),  cup / 5.5 g Breads and Crackers / Dietary Fiber (g) Plain or honey graham  crackers, 2 squares / 0.7 g Saltine crackers, 3 squares / 0.3 g Plain, salted pretzels, 10 pieces / 1.8 g Whole-wheat bread, 1 slice / 1.9 g White bread, 1 slice / 0.7 g Raisin bread, 1 slice / 1.2 g Plain bagel, 3 oz / 2 g Flour tortilla, 1 oz / 0.9 g Corn tortilla, 1 small / 1.5 g Hamburger or hotdog bun, 1 small / 0.9 g Fruits / Dietary Fiber (g) Apple with skin, 1 medium / 4.4 g Sweetened applesauce,  cup / 1.5 g Banana,  medium / 1.5 g Grapes, 10 grapes / 0.4 g Orange, 1 small / 2.3 g Raisin, 1.5 oz / 1.6 g Melon, 1 cup / 1.4 g Vegetables / Dietary Fiber (g) Green beans (canned),  cup / 1.3 g Carrots (cooked),  cup / 2.3 g Broccoli (cooked),  cup / 2.8 g Peas (cooked),  cup / 4.4 g Mashed potatoes,  cup / 1.6 g Lettuce, 1 cup / 0.5 g Corn (canned),  cup / 1.6 g Tomato,  cup / 1.1 g Document Released: 03/07/2005 Document Revised: 09/06/2011 Document Reviewed: 06/09/2011 [facility] Patient Information 2013 [place], [place]. Hemorrhoidectomy Hemorrhoidectomy is surgery to remove hemorrhoids. Hemorrhoids are veins that have become swollen in the rectum. The rectum is the area from the bottom end of the intestines to the opening where bowel movements leave the body. Hemorrhoids can be uncomfortable. They can cause itching, bleeding and pain if a blood clot forms in them (thrombose). If hemorrhoids are small, surgery may not be  needed. But if they cover a larger area, surgery is usually suggested.  LET YOUR CAREGIVER KNOW ABOUT:   Any allergies.  All medications you are taking, including:  Herbs, eyedrops, over-the-counter medications and creams.  Blood thinners (anticoagulants), aspirin or other drugs that could affect blood clotting.  Use of steroids (by mouth or as creams).  Previous problems with anesthetics, including local anesthetics.  Possibility of pregnancy, if this applies.  Any history of blood clots.  Any history of bleeding or other blood  problems.  Previous surgery.  Smoking history.  Other health problems. RISKS AND COMPLICATIONS All surgery carries some risk. However, hemorrhoid surgery usually goes smoothly. Possible complications could include:  Urinary retention.  Bleeding.  Infection.  A painful incision.  A reaction to the anesthesia (this is not common). BEFORE THE PROCEDURE   Stop using aspirin and non-steroidal anti-inflammatory drugs (NSAIDs) for pain relief. This includes prescription drugs and over-the-counter drugs such as ibuprofen and naproxen. Also stop taking vitamin E. If possible, do this two weeks before your surgery.  If you take blood-thinners, ask your healthcare provider when you should stop taking them.  You will probably have blood and urine tests done several days before your surgery.  Do not eat or drink for about 8 hours before the surgery.  Arrive at least an hour before the surgery, or whenever your surgeon recommends. This will give you time to check in and fill out any needed paperwork.  Hemorrhoidectomy is often an outpatient procedure. This means you will be able to go home the same day. Sometimes, though, people stay overnight in the hospital after the procedure. Ask your surgeon what to expect. Either way, make arrangements in advance for someone to drive you home. PROCEDURE   The preparation:  You will change into a hospital gown.  You will be given an IV. A needle will be inserted in your arm. Medication can flow directly into your body through this needle.  You might be given an enema to clear your rectum.  Once in the operating room, you will probably lie on your side or be repositioned later to lying on your stomach.  You will be given anesthesia (medication) so you will not feel anything during the surgery. The surgery often is done with local anesthesia (the area near the hemorrhoids will be numb and you will be drowsy but awake). Sometimes, general anesthesia  is used (you will be asleep during the procedure).  The procedure:  There are a few different procedures for hemorrhoids. Be sure to ask you surgeon about the procedure, the risks and benefits.  Be sure to ask about what you need to do to take care of the wound, if there is one. AFTER THE PROCEDURE  You will stay in a recovery area until the anesthesia has worn off. Your blood pressure and pulse will be checked every so often.  You may feel a lot of pain in the area of the rectum.  Take all pain medication prescribed by your surgeon. Ask before taking any over-the-counter pain medicines.  Sometimes sitting in a warm bath can help relieve your pain.  To make sure you have bowel movements without straining:  You will probably need to take stool softeners (usually a pill) for a few days.  You should drink 8 to 10 glasses of water each day.  Your activity will be restricted for awhile. Ask your caregiver for a list of what you should and should not do while you  recover. Document Released: 01/02/2009 Document Revised: 05/30/2011 Document Reviewed: 01/02/2009 [facility] Patient Information 2013 [place], [place]. Hemorrhoids Hemorrhoids are enlarged (dilated) veins around the rectum. There are 2 types of hemorrhoids, and the type of hemorrhoid is determined by its location. Internal hemorrhoids occur in the veins just inside the rectum.They are usually not painful, but they may bleed.However, they may poke through to the outside and become irritated and painful. External hemorrhoids involve the veins outside the anus and can be felt as a painful swelling or hard lump near the anus.They are often itchy and may crack and bleed. Sometimes clots will form in the veins. This makes them swollen and painful. These are called thrombosed hemorrhoids. CAUSES Causes of hemorrhoids include:  Pregnancy. This increases the pressure in the hemorrhoidal veins.  Constipation.  Straining to have a bowel  movement.  Obesity.  Heavy lifting or other activity that caused you to strain. TREATMENT Most of the time hemorrhoids improve in 1 to 2 weeks. However, if symptoms do not seem to be getting better or if you have a lot of rectal bleeding, your caregiver may perform a procedure to help make the hemorrhoids get smaller or remove them completely.Possible treatments include:  Rubber band ligation. A rubber band is placed at the base of the hemorrhoid to cut off the circulation.  Sclerotherapy. A chemical is injected to shrink the hemorrhoid.  Infrared light therapy. Tools are used to burn the hemorrhoid.  Hemorrhoidectomy. This is surgical removal of the hemorrhoid. HOME CARE INSTRUCTIONS   Increase fiber in your diet. Ask your caregiver about using fiber supplements.  Drink enough water and fluids to keep your urine clear or pale yellow.  Exercise regularly.  Go to the bathroom when you have the urge to have a bowel movement. Do not wait.  Avoid straining to have bowel movements.  Keep the anal area dry and clean.  Only take over-the-counter or prescription medicines for pain, discomfort, or fever as directed by your caregiver. If your hemorrhoids are thrombosed:  Take warm sitz baths for 20 to 30 minutes, 3 to 4 times per day.  If the hemorrhoids are very tender and swollen, place ice packs on the area as tolerated. Using ice packs between sitz baths may be helpful. Fill a plastic bag with ice. Place a towel between the bag of ice and your skin.  Medicated creams and suppositories may be used or applied as directed.  Do not use a donut-shaped pillow or sit on the toilet for long periods. This increases blood pooling and pain. SEEK MEDICAL CARE IF:   You have increasing pain and swelling that is not controlled with your medicine.  You have uncontrolled bleeding.  You have difficulty or you are unable to have a bowel movement.  You have pain or inflammation outside the  area of the hemorrhoids.  You have chills or an oral temperature above 102 F (38.9 C). MAKE SURE YOU:   Understand these instructions.  Will watch your condition.  Will get help right away if you are not doing well or get worse. Document Released: 03/04/2000 Document Revised: 05/30/2011 Document Reviewed: 02/15/2010 [facility] Patient Information 2013 [place], [place].

## 2012-03-26 NOTE — Progress Notes (Signed)
Patient ID: Sherry George, female   DOB: Oct 17, 1970, 42 y.o.   MRN: [id number]  Chief Complaint  Patient presents with  . New Evaluation    hems    HPI Sherry George is a 42 y.o. female.  Patient sent at the request of Dr. Erling Conte for hemorrhoid bleeding. Patient is a one year history of bleeding hemorrhoids, with prolapse and mild discomfort especially when wiping. She has occasional constipation but bowel function has otherwise been normal. Denies any severe pain or burning. Denies incontinence. HPI  Past Medical History  Diagnosis Date  . Sickle cell anemia   . Hypertension     Past Surgical History  Procedure Date  . Ectopic pregnancy surgery   . Eye surgery   . Etopic   . Swart   . Colonoscopy     Family History  Problem Relation Age of Onset  . Cancer Mother     colon  . Diabetes Mother   . Heart disease Mother   . Hypertension Mother   . Heart disease Father   . Cancer Sister     colon  . Sickle cell trait Brother     Social History History  Substance Use Topics  . Smoking status: Former Smoker    Quit date: 02/19/2012  . Smokeless tobacco: Not on file  . Alcohol Use: 1.2 oz/week    1 Glasses of wine, 1 Cans of beer per week    Allergies  Allergen Reactions  . Bactrim Other (See Comments)    Made her sick and fever  . Morphine And Related Itching  . Penicillins Itching    Current Outpatient Prescriptions  Medication Sig Dispense Refill  . buPROPion (WELLBUTRIN SR) 150 MG 12 hr tablet Take 150 mg by mouth 2 (two) times daily.      . ferrous sulfate 325 (65 FE) MG tablet Take 325 mg by mouth daily with breakfast.        . losartan-hydrochlorothiazide (HYZAAR) 100-25 MG per tablet Take 1 tablet by mouth daily.      . folic acid (FOLVITE) 1 MG tablet Take 1 mg by mouth daily.        . hydrochlorothiazide (HYDRODIURIL) 12.5 MG tablet Take 1 tablet (12.5 mg total) by mouth daily.  30 tablet  1    Review of Systems Review of Systems  Constitutional:  Negative for fever, chills and unexpected weight change.  HENT: Negative for hearing loss, congestion, sore throat, trouble swallowing and voice change.   Eyes: Negative for visual disturbance.  Respiratory: Negative for cough and wheezing.   Cardiovascular: Negative for chest pain, palpitations and leg swelling.  Gastrointestinal: Positive for anal bleeding. Negative for nausea, vomiting, abdominal pain, diarrhea, constipation, blood in stool and abdominal distention.  Genitourinary: Negative for hematuria, vaginal bleeding and difficulty urinating.  Musculoskeletal: Negative for arthralgias.  Skin: Negative for rash and wound.  Neurological: Negative for seizures, syncope and headaches.  Hematological: Negative for adenopathy. Does not bruise/bleed easily.  Psychiatric/Behavioral: Negative for confusion.    Blood pressure 124/80, pulse 72, temperature 99 F (37.2 C), temperature source Oral, resp. rate 18, height 5\' 7"  (1.702 m), weight 218 lb 3.2 oz (98.975 kg).  Physical Exam Physical Exam  Constitutional: She is oriented to person, place, and time. She appears well-developed and well-nourished.  HENT:  Head: Normocephalic and atraumatic.  Eyes: EOM are normal. Pupils are equal, round, and reactive to light.  Neck: Normal range of motion. Neck supple.  Pulmonary/Chest: Effort normal  and breath sounds normal.  Genitourinary:     Musculoskeletal: Normal range of motion.  Neurological: She is alert and oriented to person, place, and time.  Skin: Skin is warm and dry.  Psychiatric: She has a normal mood and affect. Her behavior is normal. Judgment and thought content normal.     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  She will discuss with family and let me know if she wants to proceed.  Information given for non operative management and dietary changes.        Sherry George A. 03/26/2012, 9:44 AM

## 2012-11-19 ENCOUNTER — Emergency Department (HOSPITAL_COMMUNITY)
Admission: EM | Admit: 2012-11-19 | Discharge: 2012-11-20 | Discharge status: Against Medical Advice | Payer: Self-pay | Attending: Emergency Medicine | Admitting: Emergency Medicine

## 2012-11-19 ENCOUNTER — Encounter (HOSPITAL_COMMUNITY): Payer: Self-pay | Admitting: Emergency Medicine

## 2012-11-19 DIAGNOSIS — M542 Cervicalgia: Secondary | ICD-10-CM | POA: Insufficient documentation

## 2012-11-19 DIAGNOSIS — I10 Essential (primary) hypertension: Secondary | ICD-10-CM | POA: Insufficient documentation

## 2012-11-19 NOTE — ED Notes (Signed)
Pt arrived to the ED with a complaint of neck pain.  Pt states she had a sharp neck pain on the left side that was shooting pain on Saturday night.  Pt states it went away after 10 minutes but has returned intermittently since with shooting pain to her jaw.

## 2012-11-20 NOTE — ED Notes (Signed)
Not in waiting room x 2 

## 2012-11-20 NOTE — ED Notes (Signed)
Bed: WU98 Expected date:  Expected time:  Means of arrival:  Comments: Clean-no monitor

## 2013-03-18 ENCOUNTER — Emergency Department (INDEPENDENT_AMBULATORY_CARE_PROVIDER_SITE_OTHER)
Admission: EM | Admit: 2013-03-18 | Discharge: 2013-03-18 | Disposition: A | Discharge status: Home / Self Care | Payer: Self-pay | Source: Home / Self Care | Attending: Emergency Medicine | Admitting: Emergency Medicine

## 2013-03-18 ENCOUNTER — Encounter (HOSPITAL_COMMUNITY): Payer: Self-pay | Admitting: Emergency Medicine

## 2013-03-18 ENCOUNTER — Emergency Department (INDEPENDENT_AMBULATORY_CARE_PROVIDER_SITE_OTHER): Payer: Self-pay

## 2013-03-18 ENCOUNTER — Emergency Department (HOSPITAL_COMMUNITY): Payer: Self-pay

## 2013-03-18 DIAGNOSIS — IMO0002 Reserved for concepts with insufficient information to code with codable children: Secondary | ICD-10-CM

## 2013-03-18 DIAGNOSIS — L02412 Cutaneous abscess of left axilla: Secondary | ICD-10-CM

## 2013-03-18 MED ORDER — DOXYCYCLINE HYCLATE 100 MG PO CAPS: 100.0000 mg | ORAL_CAPSULE | Freq: Two times a day (BID) | ORAL | Status: DC

## 2013-03-18 MED ORDER — HYDROCODONE-ACETAMINOPHEN 5-325 MG PO TABS: 1.0000 | ORAL_TABLET | Freq: Four times a day (QID) | ORAL | Status: DC | PRN

## 2013-03-18 NOTE — ED Provider Notes (Signed)
CSN: [id number]     Arrival date & time 03/18/13  1401 History   First MD Initiated Contact with Patient 03/18/13 1719     Chief Complaint  Patient presents with  . Abscess   (Consider location/radiation/quality/duration/timing/severity/associated sxs/prior Treatment) HPI Comments: 42 year old female presents complaining of painful swollen abscess in left axilla, present intermittently for almost a year, getting much worse in the past 3 days. She also admits to some left chest and shoulder pain radiating out from this abscess for the past 3 days as well. She has never had this treated before. She is asked her primary care physician to lance this but her PCP refused. She denies fever, chills, NVD, shortness of breath, diaphoresis, fatigue, lightheadedness. She has no history of coronary artery disease. She does have a history of sickle cell anemia and has had pain crises before, the chest pain does not feel anything like this, she describes the chest pain is very mild and mostly out in the lateral left chest wall around the abscess. She does smoke cigarettes.  Patient is a 42 y.o. female presenting with abscess.  Abscess Associated symptoms: no fever, no nausea and no vomiting     Past Medical History  Diagnosis Date  . Sickle cell anemia   . Hypertension    Past Surgical History  Procedure Laterality Date  . Ectopic pregnancy surgery    . Eye surgery    . Etopic    . Swart    . Colonoscopy     Family History  Problem Relation Age of Onset  . Cancer Mother     colon  . Diabetes Mother   . Heart disease Mother   . Hypertension Mother   . Heart disease Father   . Cancer Sister     colon  . Sickle cell trait Brother    History  Substance Use Topics  . Smoking status: Former Smoker    Quit date: 02/19/2012  . Smokeless tobacco: Not on file  . Alcohol Use: 1.2 oz/week    1 Glasses of wine, 1 Cans of beer per week   OB History   Grav Para Term Preterm Abortions TAB SAB Ect  Mult Living                 Review of Systems  Constitutional: Negative for fever and chills.  Eyes: Negative for visual disturbance.  Respiratory: Negative for cough and shortness of breath.   Cardiovascular: Positive for chest pain. Negative for palpitations and leg swelling.  Gastrointestinal: Negative for nausea, vomiting and abdominal pain.  Endocrine: Negative for polydipsia and polyuria.  Genitourinary: Negative for dysuria, urgency and frequency.  Musculoskeletal: Positive for arthralgias (left shoulder pain). Negative for myalgias.  Skin:       See history of present illness regarding abscess  Neurological: Negative for dizziness, weakness and light-headedness.    Allergies  Bactrim; Morphine and related; and Penicillins  Home Medications   Current Outpatient Rx  Name  Route  Sig  Dispense  Refill  . doxycycline (VIBRAMYCIN) 100 MG capsule   Oral   Take 1 capsule (100 mg total) by mouth 2 (two) times daily.   20 capsule   0   . EXPIRED: hydrochlorothiazide (HYDRODIURIL) 12.5 MG tablet   Oral   Take 1 tablet (12.5 mg total) by mouth daily.   30 tablet   1   . HYDROcodone-acetaminophen (NORCO) 5-325 MG per tablet   Oral   Take 1 tablet by mouth every 6 (  six) hours as needed for moderate pain.   10 tablet   0   . Krill Oil Omega-3 300 MG CAPS   Oral   Take 1 capsule by mouth daily.         Marland Kitchen losartan-hydrochlorothiazide (HYZAAR) 100-25 MG per tablet   Oral   Take 1 tablet by mouth daily.         . Multiple Vitamins-Minerals (WOMENS DAILY FORMULA) TABS   Oral   Take 1 tablet by mouth daily.          BP 149/84  Pulse 85  Temp(Src) 98.4 F (36.9 C)  Resp 18  SpO2 100%  LMP 03/05/2013 Physical Exam  Nursing note and vitals reviewed. Constitutional: She is oriented to person, place, and time. Vital signs are normal. She appears well-developed and well-nourished. No distress.  HENT:  Head: Normocephalic and atraumatic.  Neck: Normal range of  motion. Neck supple. No JVD present. No tracheal deviation present.  Cardiovascular: Normal rate, regular rhythm and normal heart sounds.  Exam reveals no gallop and no friction rub.   No murmur heard. Pulmonary/Chest: Effort normal and breath sounds normal. No respiratory distress. She has no wheezes. She has no rales.  Neurological: She is alert and oriented to person, place, and time. She has normal strength. Coordination normal.  Skin: Skin is warm and dry. No rash noted. She is not diaphoretic.  Left axilla: 6 cm x 3 cm area of induration with central fluctuance, tenderness to palpation  Psychiatric: She has a normal mood and affect. Judgment normal.    ED Course  INCISION AND DRAINAGE Date/Time: 03/19/2013 8:32 AM Performed by: Autumn Messing, H Authorized by: Autumn Messing, H Consent: Verbal consent obtained. Risks and benefits: risks, benefits and alternatives were discussed Consent given by: patient Patient understanding: patient states understanding of the procedure being performed Patient identity confirmed: verbally with patient Type: abscess Location: Left axilla. Anesthesia: local infiltration Local anesthetic: lidocaine 2% with epinephrine Anesthetic total: 4 ml Patient sedated: no Scalpel size: 11 Incision type: single straight Complexity: simple Drainage: purulent Drainage amount: copious Packing material: 1/4 in iodoform gauze Patient tolerance: Patient tolerated the procedure well with no immediate complications.   (including critical care time) Labs Review Labs Reviewed  CULTURE, ROUTINE-ABSCESS   Imaging Review Dg Chest 2 View  03/18/2013   CLINICAL DATA:  Abscess under the left armpit.  Prior smoker.  EXAM: CHEST  2 VIEW  COMPARISON:  01/08/2011  FINDINGS: The heart size and mediastinal contours are within normal limits. Both lungs are clear. The visualized skeletal structures are unremarkable.  IMPRESSION: No active cardiopulmonary disease.    Electronically Signed   By: Elige Ko   On: 03/18/2013 18:15      MDM   1. Abscess of axilla, left    This patient does have a significant risk factor of sickle cell anemia, however the chest pain is left lateral chest wall pain. EKG and chest x-ray are completely normal. There are no associated symptoms that you would expect with ACS. Vital signs are normal. Physical exam is normal except for the abscess.  The Abscess was incised and drained, loculations broken up with blunt dissection, quarter inch iodoform gauze packing placed. She will followup in 3 days for packing removal and probable repacking of the abscess. ED if any worsening of chest pain or development of associated symptoms of ACS, discussed with patient   Meds ordered this encounter  Medications  . HYDROcodone-acetaminophen (NORCO) 5-325 MG per  tablet    Sig: Take 1 tablet by mouth every 6 (six) hours as needed for moderate pain.    Dispense:  10 tablet    Refill:  0    Order Specific Question:  Supervising Provider    Answer:  Lorenz Coaster, DAVID C [id number]  . doxycycline (VIBRAMYCIN) 100 MG capsule    Sig: Take 1 capsule (100 mg total) by mouth 2 (two) times daily.    Dispense:  20 capsule    Refill:  0    Order Specific Question:  Supervising Provider    Answer:  Lorenz Coaster, DAVID C [[id number]]      Graylon Good, PA-C 03/19/13 [phone]

## 2013-03-18 NOTE — ED Notes (Signed)
C/o abscess under left arm for three days States it was a hair bump that was present and when she removed the hair the area became red, swollen, and painful Bacteria ointment and peroxide used but no relief.

## 2013-03-18 NOTE — Discharge Instructions (Signed)
Abscess  Care After  An abscess (also called a boil or furuncle) is an infected area that contains a collection of pus. Signs and symptoms of an abscess include pain, tenderness, redness, or hardness, or you may feel a moveable soft area under your skin. An abscess can occur anywhere in the body. The infection may spread to surrounding tissues causing cellulitis. A cut (incision) by the surgeon was made over your abscess and the pus was drained out. Gauze may have been packed into the space to provide a drain that will allow the cavity to heal from the inside outwards. The boil may be painful for 5 to 7 days. Most people with a boil do not have high fevers. Your abscess, if seen early, may not have localized, and may not have been lanced. If not, another appointment may be required for this if it does not get better on its own or with medications.  HOME CARE INSTRUCTIONS   · Only take over-the-counter or prescription medicines for pain, discomfort, or fever as directed by your caregiver.  · When you bathe, soak and then remove gauze or iodoform packs at least daily or as directed by your caregiver. You may then wash the wound gently with mild soapy water. Repack with gauze or do as your caregiver directs.  SEEK IMMEDIATE MEDICAL CARE IF:   · You develop increased pain, swelling, redness, drainage, or bleeding in the wound site.  · You develop signs of generalized infection including muscle aches, chills, fever, or a general ill feeling.  · An oral temperature above 102° F (38.9° C) develops, not controlled by medication.  See your caregiver for a recheck if you develop any of the symptoms described above. If medications (antibiotics) were prescribed, take them as directed.  Document Released: 09/23/2004 Document Revised: 05/30/2011 Document Reviewed: 05/21/2007  [employer]® Patient Information ©2014 [employer].

## 2013-03-19 NOTE — ED Provider Notes (Signed)
Medical screening examination/treatment/procedure(s) were performed by non-physician practitioner and as supervising physician I was immediately available for consultation/collaboration.  Leslee Home, M.D.  Reuben Likes, MD 03/19/13 [phone]

## 2013-03-21 ENCOUNTER — Emergency Department (INDEPENDENT_AMBULATORY_CARE_PROVIDER_SITE_OTHER)
Admission: EM | Admit: 2013-03-21 | Discharge: 2013-03-21 | Disposition: A | Discharge status: Home / Self Care | Payer: 59 | Source: Home / Self Care | Attending: Emergency Medicine | Admitting: Emergency Medicine

## 2013-03-21 ENCOUNTER — Encounter (HOSPITAL_COMMUNITY): Payer: Self-pay | Admitting: Emergency Medicine

## 2013-03-21 DIAGNOSIS — IMO0002 Reserved for concepts with insufficient information to code with codable children: Secondary | ICD-10-CM

## 2013-03-21 DIAGNOSIS — Z4801 Encounter for change or removal of surgical wound dressing: Secondary | ICD-10-CM

## 2013-03-21 DIAGNOSIS — L02419 Cutaneous abscess of limb, unspecified: Secondary | ICD-10-CM

## 2013-03-21 HISTORY — DX: Methicillin resistant Staphylococcus aureus infection, unspecified site: A49.02

## 2013-03-21 NOTE — ED Provider Notes (Signed)
CSN: [id number]     Arrival date & time 03/21/13  1328 History   First MD Initiated Contact with Patient 03/21/13 1513     Chief Complaint  Patient presents with  . Follow-up   (Consider location/radiation/quality/duration/timing/severity/associated sxs/prior Treatment) HPI patient presents for followup of left axilla abscess. Pain has decreased, but overall feels better, site is still draining. No fever, chills. Chest pain she mentioned previously has resolved.  Past Medical History  Diagnosis Date  . Sickle cell anemia   . Hypertension    Past Surgical History  Procedure Laterality Date  . Ectopic pregnancy surgery    . Eye surgery    . Etopic    . Swart    . Colonoscopy     Family History  Problem Relation Age of Onset  . Cancer Mother     colon  . Diabetes Mother   . Heart disease Mother   . Hypertension Mother   . Heart disease Father   . Cancer Sister     colon  . Sickle cell trait Brother    History  Substance Use Topics  . Smoking status: Former Smoker    Quit date: 02/19/2012  . Smokeless tobacco: Not on file  . Alcohol Use: 1.2 oz/week    1 Glasses of wine, 1 Cans of beer per week   OB History   Grav Para Term Preterm Abortions TAB SAB Ect Mult Living                 Review of Systems  Constitutional: Negative for fever and chills.  Eyes: Negative for visual disturbance.  Respiratory: Negative for cough and shortness of breath.   Cardiovascular: Negative for chest pain, palpitations and leg swelling.  Gastrointestinal: Negative for nausea, vomiting and abdominal pain.  Endocrine: Negative for polydipsia and polyuria.  Genitourinary: Negative for dysuria, urgency and frequency.  Musculoskeletal: Negative for arthralgias and myalgias.  Skin: Positive for wound.  Neurological: Negative for dizziness, weakness and light-headedness.    Allergies  Bactrim; Morphine and related; and Penicillins  Home Medications   Current Outpatient Rx  Name  Route   Sig  Dispense  Refill  . doxycycline (VIBRAMYCIN) 100 MG capsule   Oral   Take 1 capsule (100 mg total) by mouth 2 (two) times daily.   20 capsule   0   . EXPIRED: hydrochlorothiazide (HYDRODIURIL) 12.5 MG tablet   Oral   Take 1 tablet (12.5 mg total) by mouth daily.   30 tablet   1   . HYDROcodone-acetaminophen (NORCO) 5-325 MG per tablet   Oral   Take 1 tablet by mouth every 6 (six) hours as needed for moderate pain.   10 tablet   0   . Krill Oil Omega-3 300 MG CAPS   Oral   Take 1 capsule by mouth daily.         Marland Kitchen. losartan-hydrochlorothiazide (HYZAAR) 100-25 MG per tablet   Oral   Take 1 tablet by mouth daily.         . Multiple Vitamins-Minerals (WOMENS DAILY FORMULA) TABS   Oral   Take 1 tablet by mouth daily.          BP 120/73  Pulse 68  Temp(Src) 98.6 F (37 C) (Oral)  Resp 16  SpO2 100%  LMP 03/05/2013 Physical Exam  Nursing note and vitals reviewed. Constitutional: She is oriented to person, place, and time. Vital signs are normal. She appears well-developed and well-nourished. No distress.  HENT:  Head: Normocephalic and atraumatic.  Pulmonary/Chest: Effort normal. No respiratory distress.  Neurological: She is alert and oriented to person, place, and time. She has normal strength. Coordination normal.  Skin: Skin is warm and dry. No rash noted. She is not diaphoretic.  Left axilla: Open wound from abscess that was drained, iodoform packing still in place, induration is resolving  Psychiatric: She has a normal mood and affect. Judgment normal.    ED Course  Procedures (including critical care time) Labs Review Labs Reviewed - No data to display Imaging Review No results found.    MDM   1. Abscess, axilla    There is still a significant abscess cavity. This will need to be repacked at least today and probably one more time. She will followup again in 3-4 days for wound check     Graylon Good, PA-C 03/21/13 1543

## 2013-03-21 NOTE — ED Notes (Signed)
C/o follow up on abscess under left arm States she did start antibiotics yesterday  States she does feel a lot better

## 2013-03-21 NOTE — Discharge Instructions (Signed)
Abscess  Care After  An abscess (also called a boil or furuncle) is an infected area that contains a collection of pus. Signs and symptoms of an abscess include pain, tenderness, redness, or hardness, or you may feel a moveable soft area under your skin. An abscess can occur anywhere in the body. The infection may spread to surrounding tissues causing cellulitis. A cut (incision) by the surgeon was made over your abscess and the pus was drained out. Gauze may have been packed into the space to provide a drain that will allow the cavity to heal from the inside outwards. The boil may be painful for 5 to 7 days. Most people with a boil do not have high fevers. Your abscess, if seen early, may not have localized, and may not have been lanced. If not, another appointment may be required for this if it does not get better on its own or with medications.  HOME CARE INSTRUCTIONS   · Only take over-the-counter or prescription medicines for pain, discomfort, or fever as directed by your caregiver.  · When you bathe, soak and then remove gauze or iodoform packs at least daily or as directed by your caregiver. You may then wash the wound gently with mild soapy water. Repack with gauze or do as your caregiver directs.  SEEK IMMEDIATE MEDICAL CARE IF:   · You develop increased pain, swelling, redness, drainage, or bleeding in the wound site.  · You develop signs of generalized infection including muscle aches, chills, fever, or a general ill feeling.  · An oral temperature above 102° F (38.9° C) develops, not controlled by medication.  See your caregiver for a recheck if you develop any of the symptoms described above. If medications (antibiotics) were prescribed, take them as directed.  Document Released: 09/23/2004 Document Revised: 05/30/2011 Document Reviewed: 05/21/2007  [employer]® Patient Information ©2014 [employer].

## 2013-03-21 NOTE — ED Provider Notes (Signed)
Medical screening examination/treatment/procedure(s) were performed by non-physician practitioner and as supervising physician I was immediately available for consultation/collaboration.  Claris Pech, M.D.  Minka Knight C Meleana Commerford, MD 03/21/13 2024 

## 2013-03-23 LAB — CULTURE, ROUTINE-ABSCESS

## 2013-03-24 ENCOUNTER — Telehealth (HOSPITAL_COMMUNITY): Payer: Self-pay | Admitting: *Deleted

## 2013-03-24 NOTE — ED Notes (Addendum)
Abscess culture arm: Rare MRSA.  Pt. adequately treated with I and D and Doxycycline.  I called and left a message to call. Vassie MoselleYork, Catheleen Langhorne M 03/24/2013 Left message. Call 2. 03/26/2013

## 2013-03-25 ENCOUNTER — Encounter (HOSPITAL_COMMUNITY): Payer: Self-pay | Admitting: Emergency Medicine

## 2013-03-25 NOTE — ED Notes (Signed)
Patient returned call from Sherry George. Advised of positive MRSA findings, and advised she should complete rx as prescribed, and to inform future providers of MRSA history

## 2013-03-26 ENCOUNTER — Emergency Department (INDEPENDENT_AMBULATORY_CARE_PROVIDER_SITE_OTHER)
Admission: EM | Admit: 2013-03-26 | Discharge: 2013-03-26 | Disposition: A | Discharge status: Home / Self Care | Payer: 59 | Source: Home / Self Care | Attending: Family Medicine | Admitting: Family Medicine

## 2013-03-26 ENCOUNTER — Encounter (HOSPITAL_COMMUNITY): Payer: Self-pay | Admitting: Emergency Medicine

## 2013-03-26 DIAGNOSIS — Z5189 Encounter for other specified aftercare: Secondary | ICD-10-CM

## 2013-03-26 DIAGNOSIS — Z4800 Encounter for change or removal of nonsurgical wound dressing: Secondary | ICD-10-CM

## 2013-03-26 NOTE — ED Provider Notes (Signed)
CSN: [id number]     Arrival date & time 03/26/13  0854 History   First MD Initiated Contact with Patient 03/26/13 [phone]     Chief Complaint  Patient presents with  . Wound Check   (Consider location/radiation/quality/duration/timing/severity/associated sxs/prior Treatment) Patient is a 43 y.o. female presenting with wound check. The history is provided by the patient.  Wound Check This is a new problem. The current episode started more than 2 days ago (left axillary abscess packed on 1/1 , here for removal.). The problem has been resolved.    Past Medical History  Diagnosis Date  . Sickle cell anemia   . Hypertension   . MRSA infection    Past Surgical History  Procedure Laterality Date  . Ectopic pregnancy surgery    . Eye surgery    . Etopic    . Swart    . Colonoscopy     Family History  Problem Relation Age of Onset  . Cancer Mother     colon  . Diabetes Mother   . Heart disease Mother   . Hypertension Mother   . Heart disease Father   . Cancer Sister     colon  . Sickle cell trait Brother    History  Substance Use Topics  . Smoking status: Former Smoker    Quit date: 02/19/2012  . Smokeless tobacco: Not on file  . Alcohol Use: 1.2 oz/week    1 Glasses of wine, 1 Cans of beer per week   OB History   Grav Para Term Preterm Abortions TAB SAB Ect Mult Living                 Review of Systems  Constitutional: Negative.   Skin: Positive for wound.    Allergies  Bactrim; Morphine and related; and Penicillins  Home Medications   Current Outpatient Rx  Name  Route  Sig  Dispense  Refill  . doxycycline (VIBRAMYCIN) 100 MG capsule   Oral   Take 1 capsule (100 mg total) by mouth 2 (two) times daily.   20 capsule   0   . EXPIRED: hydrochlorothiazide (HYDRODIURIL) 12.5 MG tablet   Oral   Take 1 tablet (12.5 mg total) by mouth daily.   30 tablet   1   . HYDROcodone-acetaminophen (NORCO) 5-325 MG per tablet   Oral   Take 1 tablet by mouth every 6 (six)  hours as needed for moderate pain.   10 tablet   0   . Krill Oil Omega-3 300 MG CAPS   Oral   Take 1 capsule by mouth daily.         Marland Kitchen. losartan-hydrochlorothiazide (HYZAAR) 100-25 MG per tablet   Oral   Take 1 tablet by mouth daily.         . Multiple Vitamins-Minerals (WOMENS DAILY FORMULA) TABS   Oral   Take 1 tablet by mouth daily.          BP 136/90  Pulse 62  Temp(Src) 99 F (37.2 C) (Oral)  Resp 18  SpO2 99%  LMP 03/05/2013 Physical Exam  Nursing note and vitals reviewed. Constitutional: She is oriented to person, place, and time. She appears well-developed and well-nourished.  Neurological: She is alert and oriented to person, place, and time.  Skin: Skin is warm and dry.  Axillary abscess, packing removed, no drainage, nontender.    ED Course  Procedures (including critical care time) Labs Review Labs Reviewed - No data to display Imaging Review No  results found.  EKG Interpretation    Date/Time:    Ventricular Rate:    PR Interval:    QRS Duration:   QT Interval:    QTC Calculation:   R Axis:     Text Interpretation:              MDM  Packing removed, no problems.    Linna Hoff, MD 03/26/13 [phone]

## 2013-03-26 NOTE — ED Notes (Signed)
Pink  Glasses  Found  In  Room  When  Discharged  Pt    Message  Left pt  s  Phone  Left  In lost and  Found  Box  At  Lab  Nurse  Desk

## 2013-03-26 NOTE — ED Notes (Signed)
Pt  Here  For  Recheck  Of  Wound  -  Pt  Had  i  And  D  Recently  The  Wound  Appears  Healing

## 2013-03-26 NOTE — Discharge Instructions (Signed)
Finish antibiotic, continue local washing and triple antibiotic ointment as needed.return if any further problems. °

## 2013-03-29 NOTE — ED Notes (Signed)
Unable to reach pt. by phone x 3.  Confidential marked letter sent with diagnosis and [facility]. Vassie MoselleYork, Silveria Botz M 03/29/2013

## 2017-06-11 ENCOUNTER — Emergency Department (HOSPITAL_BASED_OUTPATIENT_CLINIC_OR_DEPARTMENT_OTHER): Payer: 59

## 2017-06-11 ENCOUNTER — Other Ambulatory Visit: Payer: Self-pay

## 2017-06-11 ENCOUNTER — Encounter (HOSPITAL_BASED_OUTPATIENT_CLINIC_OR_DEPARTMENT_OTHER): Payer: Self-pay | Admitting: Emergency Medicine

## 2017-06-11 ENCOUNTER — Emergency Department (HOSPITAL_COMMUNITY)
Admission: EM | Admit: 2017-06-11 | Discharge: 2017-06-11 | Discharge status: Against Medical Advice | Payer: 59 | Source: Home / Self Care

## 2017-06-11 ENCOUNTER — Emergency Department (HOSPITAL_BASED_OUTPATIENT_CLINIC_OR_DEPARTMENT_OTHER)
Admission: EM | Admit: 2017-06-11 | Discharge: 2017-06-11 | Disposition: A | Discharge status: Home / Self Care | Payer: 59 | Attending: Emergency Medicine | Admitting: Emergency Medicine

## 2017-06-11 DIAGNOSIS — Z79899 Other long term (current) drug therapy: Secondary | ICD-10-CM | POA: Insufficient documentation

## 2017-06-11 DIAGNOSIS — M79605 Pain in left leg: Secondary | ICD-10-CM | POA: Diagnosis present

## 2017-06-11 DIAGNOSIS — Z87891 Personal history of nicotine dependence: Secondary | ICD-10-CM | POA: Diagnosis not present

## 2017-06-11 DIAGNOSIS — I10 Essential (primary) hypertension: Secondary | ICD-10-CM | POA: Diagnosis not present

## 2017-06-11 LAB — CBC WITH DIFFERENTIAL/PLATELET
Basophils Absolute: 0 10*3/uL (ref 0.0–0.1)
Basophils Relative: 0 %
Eosinophils Absolute: 0.1 10*3/uL (ref 0.0–0.7)
Eosinophils Relative: 1 %
HCT: 34.3 % — ABNORMAL LOW (ref 36.0–46.0)
Hemoglobin: 12.6 g/dL (ref 12.0–15.0)
Lymphocytes Relative: 17 %
Lymphs Abs: 1.9 10*3/uL (ref 0.7–4.0)
MCH: 28.1 pg (ref 26.0–34.0)
MCHC: 36.7 g/dL — ABNORMAL HIGH (ref 30.0–36.0)
MCV: 76.4 fL — ABNORMAL LOW (ref 78.0–100.0)
Monocytes Absolute: 0.6 10*3/uL (ref 0.1–1.0)
Monocytes Relative: 5 %
Neutro Abs: 8.6 10*3/uL — ABNORMAL HIGH (ref 1.7–7.7)
Neutrophils Relative %: 77 %
Platelets: 210 10*3/uL (ref 150–400)
RBC: 4.49 MIL/uL (ref 3.87–5.11)
RDW: 17.8 % — ABNORMAL HIGH (ref 11.5–15.5)
WBC: 11.1 10*3/uL — ABNORMAL HIGH (ref 4.0–10.5)

## 2017-06-11 LAB — RETICULOCYTES
RBC.: 4.52 MIL/uL (ref 3.87–5.11)
Retic Count, Absolute: 262.2 10*3/uL — ABNORMAL HIGH (ref 19.0–186.0)
Retic Ct Pct: 5.8 % — ABNORMAL HIGH (ref 0.4–3.1)

## 2017-06-11 LAB — BASIC METABOLIC PANEL
Anion gap: 10 (ref 5–15)
BUN: 7 mg/dL (ref 6–20)
CO2: 25 mmol/L (ref 22–32)
Calcium: 9.2 mg/dL (ref 8.9–10.3)
Chloride: 102 mmol/L (ref 101–111)
Creatinine, Ser: 0.73 mg/dL (ref 0.44–1.00)
GFR calc Af Amer: >60 mL/min (ref >60)
GFR calc non Af Amer: >60 mL/min (ref >60)
Glucose, Bld: 106 mg/dL — ABNORMAL HIGH (ref 65–99)
Potassium: 3.6 mmol/L (ref 3.5–5.1)
Sodium: 137 mmol/L (ref 135–145)

## 2017-06-11 MED ORDER — IBUPROFEN 600 MG PO TABS: 600.0000 mg | ORAL_TABLET | Freq: Four times a day (QID) | ORAL | 0 refills | Status: DC | PRN

## 2017-06-11 MED ORDER — METHOCARBAMOL 500 MG PO TABS
750.0000 mg | ORAL_TABLET | Freq: Once | ORAL | Status: AC
Start: 2017-06-11 — End: 2017-06-11
  Administered 2017-06-11: 750 mg via ORAL
  Filled 2017-06-11: qty 2

## 2017-06-11 MED ORDER — SODIUM CHLORIDE 0.45 % IV SOLN
INTRAVENOUS | Status: DC
  Administered 2017-06-11: 15:00:00 via INTRAVENOUS

## 2017-06-11 MED ORDER — METHOCARBAMOL 500 MG PO TABS: 500.0000 mg | ORAL_TABLET | Freq: Two times a day (BID) | ORAL | 0 refills | Status: DC

## 2017-06-11 MED ORDER — KETOROLAC TROMETHAMINE 30 MG/ML IJ SOLN
30.0000 mg | INTRAMUSCULAR | Status: AC
  Administered 2017-06-11: 30 mg via INTRAVENOUS
  Filled 2017-06-11: qty 1

## 2017-06-11 NOTE — Discharge Instructions (Signed)
Medications: Robaxin, ibuprofen  Treatment: Take Robaxin twice daily as needed for muscle pain or spasms.  Do not drive or operate machinery while taking this medication.  Take ibuprofen every 6 hours as needed for pain.  You can alternate with Tylenol as prescribed over-the-counter.  Use ice and heat alternating 20 minutes on, 20 minutes off.  Attempt the exercises and stretches attached, as tolerated 1-2 times daily.  Follow-up: Please follow-up with your doctor this week for recheck of your symptoms and recheck of your blood pressure.  You may need to restart your blood pressure medication or a different one.  Please return to the emergency department if you develop any new or worsening symptoms including complete numbness of your leg or groin area, loss of bowel or bladder control, or any other new or concerning symptom.

## 2017-06-11 NOTE — ED Provider Notes (Signed)
[facility] EMERGENCY DEPARTMENT Provider Note   CSN: [id number] Arrival date & time: 06/11/17  1217     History   Chief Complaint Chief Complaint  Patient presents with  . Leg Pain    HPI Sherry George is a 47 y.o. female with history of hypertension, sickle cell anemia who presents with a 2-day history of left leg pain and swelling.  Patient reports her pain started after she drove to [place] for convention.  Her pain started little after the 4-hour trip.  She describes the pain as a dull ache with intermittent sharp pains.  She reports she has not had a sickle cell crisis in several years.  She states it feels a little bit like that, however she forgets exactly what it feels like.  She denies any numbness or tingling.  She reports she always has back pain, but her back pain is not worse.  She took Tylenol at home without relief.  She has no history of blood clots.  She has felt dehydrated over the past few days.  She denies any chest pain, shortness of breath, abdominal pain.  She reports at one point her pain made her nauseous, but no vomiting.  HPI  Past Medical History:  Diagnosis Date  . Hypertension   . MRSA infection   . Sickle cell anemia (HCC)     There are no active problems to display for this patient.   Past Surgical History:  Procedure Laterality Date  . COLONOSCOPY    . ECTOPIC PREGNANCY SURGERY    . etopic    . EYE SURGERY    . swart       OB History   None      Home Medications    Prior to Admission medications   Medication Sig Start Date End Date Taking? Authorizing Provider  BLACK CURRANT SEED OIL PO Take by mouth.   Yes [provider]  doxycycline (VIBRAMYCIN) 100 MG capsule Take 1 capsule (100 mg total) by mouth 2 (two) times daily. 03/18/13  Yes Baker, Zachary H, PA-C  ferrous sulfate 325 (65 FE) MG EC tablet Take 325 mg by mouth 3 (three) times daily with meals.   Yes [provider]  HYDROcodone-acetaminophen  (NORCO) 5-325 MG per tablet Take 1 tablet by mouth every 6 (six) hours as needed for moderate pain. 03/18/13  Yes Baker, Adrian Blackwater, PA-C  Krill Oil Omega-3 300 MG CAPS Take 1 capsule by mouth daily.   Yes [provider]  losartan-hydrochlorothiazide (HYZAAR) 100-25 MG per tablet Take 1 tablet by mouth daily.   Yes [provider]  hydrochlorothiazide (HYDRODIURIL) 12.5 MG tablet Take 1 tablet (12.5 mg total) by mouth daily. 02/14/11 02/14/12  Grant Fontana, PA-C  ibuprofen (ADVIL,MOTRIN) 600 MG tablet Take 1 tablet (600 mg total) by mouth every 6 (six) hours as needed. 06/11/17   Delora Gravatt, Waylan Boga, PA-C  methocarbamol (ROBAXIN) 500 MG tablet Take 1 tablet (500 mg total) by mouth 2 (two) times daily. 06/11/17   Jianni Shelden, Waylan Boga, PA-C  Multiple Vitamins-Minerals (WOMENS DAILY FORMULA) TABS Take 1 tablet by mouth daily.    [provider]    Family History Family History  Problem Relation Age of Onset  . Cancer Mother        colon  . Diabetes Mother   . Heart disease Mother   . Hypertension Mother   . Heart disease Father   . Cancer Sister  colon  . Sickle cell trait Brother     Social History Social History   Tobacco Use  . Smoking status: Former Smoker    Last attempt to quit: 02/19/2012    Years since quitting: 5.3  . Smokeless tobacco: Never Used  Substance Use Topics  . Alcohol use: Yes    Alcohol/week: 1.2 oz    Types: 1 Glasses of wine, 1 Cans of beer per week  . Drug use: No     Allergies   Bactrim; Morphine and related; and Penicillins   Review of Systems Review of Systems  Constitutional: Negative for chills and fever.  HENT: Negative for facial swelling and sore throat.   Respiratory: Negative for shortness of breath.   Cardiovascular: Negative for chest pain.  Gastrointestinal: Negative for abdominal pain, nausea and vomiting.  Genitourinary: Negative for dysuria.  Musculoskeletal: Positive for back pain, joint swelling  and myalgias.  Skin: Negative for rash and wound.  Neurological: Negative for headaches.  Psychiatric/Behavioral: The patient is not nervous/anxious.      Physical Exam Updated Vital Signs BP (!) 162/89   Pulse 61   Temp 99 F (37.2 C) (Oral)   Resp 16   Ht 5\' 7"  (1.702 m)   Wt 108.9 kg (240 lb)   LMP 06/05/2017   SpO2 100%   BMI 37.59 kg/m   Physical Exam  Constitutional: She appears well-developed and well-nourished. No distress.  HENT:  Head: Normocephalic and atraumatic.  Mouth/Throat: Oropharynx is clear and moist. No oropharyngeal exudate.  Eyes: Pupils are equal, round, and reactive to light. Conjunctivae are normal. Right eye exhibits no discharge. Left eye exhibits no discharge. No scleral icterus.  Neck: Normal range of motion. Neck supple. No thyromegaly present.  Cardiovascular: Normal rate, regular rhythm, normal heart sounds and intact distal pulses. Exam reveals no gallop and no friction rub.  No murmur heard. Pulmonary/Chest: Effort normal and breath sounds normal. No stridor. No respiratory distress. She has no wheezes. She has no rales.  Abdominal: Soft. Bowel sounds are normal. She exhibits no distension. There is no tenderness. There is no rebound and no guarding.  Musculoskeletal: She exhibits no edema.       Left upper leg: She exhibits tenderness and swelling.       Legs: No posterior tenderness or calf tenderness No midline cervical, thoracic, or lumbar tenderness  Lymphadenopathy:    She has no cervical adenopathy.  Neurological: She is alert. Coordination normal.  Skin: Skin is warm and dry. No rash noted. She is not diaphoretic. No pallor.  Psychiatric: She has a normal mood and affect.  Nursing note and vitals reviewed.    ED Treatments / Results  Labs (all labs ordered are listed, but only abnormal results are displayed) Labs Reviewed  CBC WITH DIFFERENTIAL/PLATELET - Abnormal; Notable for the following components:      Result Value    WBC 11.1 (*)    HCT 34.3 (*)    MCV 76.4 (*)    MCHC 36.7 (*)    RDW 17.8 (*)    Neutro Abs 8.6 (*)    All other components within normal limits  BASIC METABOLIC PANEL - Abnormal; Notable for the following components:   Glucose, Bld 106 (*)    All other components within normal limits  RETICULOCYTES - Abnormal; Notable for the following components:   Retic Ct Pct 5.8 (*)    Retic Count, Absolute 262.2 (*)    All other components within normal limits  EKG None  Radiology [place] Venous Img Lower Unilateral Left  Result Date: 06/11/2017 CLINICAL DATA:  47 year old with left thigh pain. EXAM: LEFT LOWER EXTREMITY VENOUS DOPPLER ULTRASOUND TECHNIQUE: Gray-scale sonography with graded compression, as well as color Doppler and duplex ultrasound were performed to evaluate the lower extremity deep venous systems from the level of the common femoral vein and including the common femoral, femoral, profunda femoral, popliteal and calf veins including the posterior tibial, peroneal and gastrocnemius veins when visible. The superficial great saphenous vein was also interrogated. Spectral Doppler was utilized to evaluate flow at rest and with distal augmentation maneuvers in the common femoral, femoral and popliteal veins. COMPARISON:  None. FINDINGS: Contralateral Common Femoral Vein: Respiratory phasicity is normal and symmetric with the symptomatic side. No evidence of thrombus. Normal compressibility. Common Femoral Vein: No evidence of thrombus. Normal compressibility, respiratory phasicity and response to augmentation. Saphenofemoral Junction: No evidence of thrombus. Normal compressibility and flow on color Doppler imaging. Profunda Femoral Vein: No evidence of thrombus. Normal compressibility and flow on color Doppler imaging. Femoral Vein: No evidence of thrombus. Normal compressibility, respiratory phasicity and response to augmentation. Popliteal Vein: No evidence of thrombus. Normal compressibility,  respiratory phasicity and response to augmentation. Calf Veins: No evidence of thrombus. Normal compressibility and flow on color Doppler imaging. Superficial Great Saphenous Vein: No evidence of thrombus. Normal compressibility. Other Findings:  None. IMPRESSION: Negative for deep venous thrombosis in left lower extremity. Electronically Signed   By: Richarda OverlieAdam  Henn M.D.   On: 06/11/2017 15:55    Procedures Procedures (including critical care time)  Medications Ordered in ED Medications  0.45 % sodium chloride infusion ( Intravenous Stopped 06/11/17 1659)  ketorolac (TORADOL) 30 MG/ML injection 30 mg (30 mg Intravenous Given 06/11/17 1436)  methocarbamol (ROBAXIN) tablet 750 mg (750 mg Oral Given 06/11/17 1658)     Initial Impression / Assessment and Plan / ED Course  I have reviewed the triage vital signs and the nursing notes.  Pertinent labs & imaging results that were available during my care of the patient were reviewed by me and considered in my medical decision making (see chart for details).  Clinical Course as of Jun 12 1731  Wynelle LinkSun Jun 11, 2017  1422 Patient with left thigh pain and left hip pain that began after a long car trip.  There is edema to the left leg.  Considering history of sickle cell, recent long trip, will rule out with DVT and assess labs for sickle cell crisis.  Pain could be related to radicular back pain.  Will initiate fluids and start with Toradol for pain control.   [AL]    Clinical Course User Index [AL] Emi HolesLaw, Lamarcus Spira M, PA-C    Patient with left leg pain for the past 2 days.  Suspect radiculopathy, as patient does have chronic low back pain and tenderness in a dermatomal pattern.  CBC shows mild leukocytosis, 11.1, but hemoglobin within normal limits.  Were to count mildly elevated, 5.8, 262.2.  BMP unremarkable.  DVT ultrasound is negative.  Patient is feeling much better after fluids, Toradol, and Robaxin.  Will discharge home with ibuprofen and Robaxin.   Patient advised to have her blood pressure rechecked at her PCP office this week as well as follow-up for her leg pain.  Patient reports she has not been taking her blood pressure medications because she has been seeing concerning things on the news regarding blood pressure medications.  Advised her to talk to her doctor about this  and she could be changed to a different medication.  Return precautions discussed.  Patient understands and agrees with plan.  Patient vitals stable throughout ED course and discharged in satisfactory condition.  Final Clinical Impressions(s) / ED Diagnoses   Final diagnoses:  Left leg pain    ED Discharge Orders        Ordered    methocarbamol (ROBAXIN) 500 MG tablet  2 times daily     06/11/17 1728    ibuprofen (ADVIL,MOTRIN) 600 MG tablet  Every 6 hours PRN     06/11/17 1728       Emi Holes, PA-C 06/11/17 1733    Cathren Laine, MD 06/20/17 [phone]

## 2017-06-11 NOTE — ED Triage Notes (Signed)
L thigh pain since yesterday. Recent long trip to [place]. Hx of sickle cell but doesn't think this is a crisis.

## 2017-07-21 ENCOUNTER — Encounter (HOSPITAL_COMMUNITY): Payer: Self-pay | Admitting: Family Medicine

## 2017-07-21 ENCOUNTER — Ambulatory Visit (HOSPITAL_COMMUNITY)
Admission: EM | Admit: 2017-07-21 | Discharge: 2017-07-21 | Disposition: A | Discharge status: Home / Self Care | Payer: 59 | Attending: Emergency Medicine | Admitting: Emergency Medicine

## 2017-07-21 DIAGNOSIS — R21 Rash and other nonspecific skin eruption: Secondary | ICD-10-CM | POA: Diagnosis not present

## 2017-07-21 MED ORDER — CEPHALEXIN 500 MG PO CAPS: 500.0000 mg | ORAL_CAPSULE | Freq: Four times a day (QID) | ORAL | 0 refills | Status: AC

## 2017-07-21 MED ORDER — FLUCONAZOLE 150 MG PO TABS: 150.0000 mg | ORAL_TABLET | Freq: Once | ORAL | 0 refills | Status: AC

## 2017-07-21 MED ORDER — PREDNISONE 50 MG PO TABS: 50.0000 mg | ORAL_TABLET | Freq: Every day | ORAL | 0 refills | Status: AC

## 2017-07-21 NOTE — ED Provider Notes (Signed)
[facility]    CSN: [id number] Arrival date & time: 07/21/17  1006     History   Chief Complaint Chief Complaint  Patient presents with  . Rash    HPI Sherry George is a 47 y.o. female history of hypertension, sickle cell anemia presents today for evaluation of a rash and swelling on her fingers.  Patient states that she recently went on vacation and [place] on the beach.  Earlier this week on Monday she developed itching and redness which which she believed to be bug bites to her right shoulder, no bugs were actually visualized.  On Wednesday as she was traveling back here to [facility] she developed itching and swelling to her fourth and fifth finger on her left hand that developed into swelling over the PIP.  She has had tenderness with touch, pain with making a full fist.  She has tried ibuprofen's, Benadryl, hydrocortisone cream, antibiotic ointment without relief.  Denies history of gout.  HPI  Past Medical History:  Diagnosis Date  . Hypertension   . MRSA infection   . Sickle cell anemia (HCC)     There are no active problems to display for this patient.   Past Surgical History:  Procedure Laterality Date  . COLONOSCOPY    . ECTOPIC PREGNANCY SURGERY    . etopic    . EYE SURGERY    . swart      OB History   None      Home Medications    Prior to Admission medications   Medication Sig Start Date End Date Taking? Authorizing Provider  BLACK CURRANT SEED OIL PO Take by mouth.    [provider]  cephALEXin (KEFLEX) 500 MG capsule Take 1 capsule (500 mg total) by mouth 4 (four) times daily for 7 days. 07/21/17 07/28/17  Quint Chestnut C, PA-C  doxycycline (VIBRAMYCIN) 100 MG capsule Take 1 capsule (100 mg total) by mouth 2 (two) times daily. 03/18/13   Graylon Good, PA-C  ferrous sulfate 325 (65 FE) MG EC tablet Take 325 mg by mouth 3 (three) times daily with meals.    [provider]  fluconazole (DIFLUCAN) 150 MG tablet  Take 1 tablet (150 mg total) by mouth once for 1 dose. 07/21/17 07/21/17  Rollyn Scialdone C, PA-C  hydrochlorothiazide (HYDRODIURIL) 12.5 MG tablet Take 1 tablet (12.5 mg total) by mouth daily. 02/14/11 02/14/12  Grant Fontana, PA-C  HYDROcodone-acetaminophen (NORCO) 5-325 MG per tablet Take 1 tablet by mouth every 6 (six) hours as needed for moderate pain. 03/18/13   Excell Seltzer, Adrian Blackwater, PA-C  ibuprofen (ADVIL,MOTRIN) 600 MG tablet Take 1 tablet (600 mg total) by mouth every 6 (six) hours as needed. 06/11/17   Law, Waylan Boga, PA-C  Krill Oil Omega-3 300 MG CAPS Take 1 capsule by mouth daily.    [provider]  losartan-hydrochlorothiazide (HYZAAR) 100-25 MG per tablet Take 1 tablet by mouth daily.    [provider]  methocarbamol (ROBAXIN) 500 MG tablet Take 1 tablet (500 mg total) by mouth 2 (two) times daily. 06/11/17   Law, Waylan Boga, PA-C  Multiple Vitamins-Minerals (WOMENS DAILY FORMULA) TABS Take 1 tablet by mouth daily.    [provider]  predniSONE (DELTASONE) 50 MG tablet Take 1 tablet (50 mg total) by mouth daily for 5 days. 07/21/17 07/26/17  Gerrit Rafalski, Junius Creamer, PA-C    Family History Family History  Problem Relation Age of Onset  . Cancer Mother  colon  . Diabetes Mother   . Heart disease Mother   . Hypertension Mother   . Heart disease Father   . Cancer Sister        colon  . Sickle cell trait Brother     Social History Social History   Tobacco Use  . Smoking status: Former Smoker    Last attempt to quit: 02/19/2012    Years since quitting: 5.4  . Smokeless tobacco: Never Used  Substance Use Topics  . Alcohol use: Yes    Alcohol/week: 1.2 oz    Types: 1 Glasses of wine, 1 Cans of beer per week  . Drug use: No     Allergies   Bactrim; Morphine and related; and Penicillins   Review of Systems Review of Systems  Constitutional: Negative for fatigue and fever.  HENT: Negative for mouth sores.   Eyes: Negative for visual  disturbance.  Respiratory: Negative for shortness of breath.   Cardiovascular: Negative for chest pain.  Gastrointestinal: Negative for abdominal pain, nausea and vomiting.  Genitourinary: Negative for genital sores.  Musculoskeletal: Positive for arthralgias and joint swelling.  Skin: Positive for color change and rash. Negative for wound.  Neurological: Negative for dizziness, weakness, light-headedness and headaches.     Physical Exam Triage Vital Signs ED Triage Vitals  Enc Vitals Group     BP 07/21/17 1035 (!) 149/91     Pulse Rate 07/21/17 1035 86     Resp 07/21/17 1035 16     Temp 07/21/17 1035 98.4 F (36.9 C)     Temp Source 07/21/17 1035 Oral     SpO2 07/21/17 1035 100 %     Weight --      Height --      Head Circumference --      Peak Flow --      Pain Score 07/21/17 1041 0     Pain Loc --      Pain Edu? --      Excl. in GC? --    No data found.  Updated Vital Signs BP (!) 149/91 (BP Location: Right Arm)   Pulse 86   Temp 98.4 F (36.9 C) (Oral)   Resp 16   SpO2 100%   Visual Acuity Right Eye Distance:   Left Eye Distance:   Bilateral Distance:    Right Eye Near:   Left Eye Near:    Bilateral Near:     Physical Exam  Constitutional: She appears well-developed and well-nourished. No distress.  HENT:  Head: Normocephalic and atraumatic.  Eyes: Conjunctivae are normal.  Neck: Neck supple.  Cardiovascular: Normal rate and regular rhythm.  No murmur heard. Pulmonary/Chest: Effort normal and breath sounds normal. No respiratory distress.  Musculoskeletal: She exhibits no edema.  Neurological: She is alert.  Skin: Skin is warm and dry. Rash noted. There is erythema.  Small erythematous circular lesions to anterior posterior shoulder, I will have central black punctate  Fourth and fifth left PIP with swelling, appears slightly fluctuant, mild erythema, full active range of motion of fingers.  Radial pulse 2+  Psychiatric: She has a normal mood and  affect.  Nursing note and vitals reviewed.            UC Treatments / Results  Labs (all labs ordered are listed, but only abnormal results are displayed) Labs Reviewed - No data to display  EKG None  Radiology No results found.  Procedures Procedures (including critical care time)  Medications Ordered in  UC Medications - No data to display  Initial Impression / Assessment and Plan / UC Course  I have reviewed the triage vital signs and the nursing notes.  Pertinent labs & imaging results that were available during my care of the patient were reviewed by me and considered in my medical decision making (see chart for details).     Patient with rash on shoulder that is resemblant of bug bites, will recommend to do conservative treatment.  We will also provide prednisone for 5 days as possible allergic reaction.  Discussed swelling on fingers to be possibly allergic reaction/bug bite versus gout versus cellulitis.  Will do the prednisone as above, NSAIDs, short course of Keflex.  Advised to return if symptoms worsening, not improving. Discussed strict return precautions. Patient verbalized understanding and is agreeable with plan.  Final Clinical Impressions(s) / UC Diagnoses   Final diagnoses:  Rash and nonspecific skin eruption     Discharge Instructions     Please begin prednisone daily for 5 days  Keflex 4 times daily for 7 days  Continue Claritin- may increase to twice daily temporarily  Take tylenol and ibuprofen.   ED Prescriptions    Medication Sig Dispense Auth. Provider   predniSONE (DELTASONE) 50 MG tablet Take 1 tablet (50 mg total) by mouth daily for 5 days. 5 tablet Novice Vrba C, PA-C   cephALEXin (KEFLEX) 500 MG capsule Take 1 capsule (500 mg total) by mouth 4 (four) times daily for 7 days. 28 capsule Charlett Merkle C, PA-C   fluconazole (DIFLUCAN) 150 MG tablet Take 1 tablet (150 mg total) by mouth once for 1 dose. 2 tablet Marwa Fuhrman,  Aariv Medlock C, PA-C     Controlled Substance Prescriptions [place] Controlled Substance Registry consulted? Not Applicable   Lew Dawes, [place] 07/21/17 1142

## 2017-07-21 NOTE — Discharge Instructions (Signed)
Please begin prednisone daily for 5 days  Keflex 4 times daily for 7 days  Continue Claritin- may increase to twice daily temporarily  Take tylenol and ibuprofen.

## 2017-07-21 NOTE — ED Triage Notes (Signed)
Pt here for possible bug bites and allergic reaction. She has bites to right chest, arm and shoulder and bites to left hand. Very itchy. Swelling to left hand. She has been using cream and benadryl.

## 2018-05-06 IMAGING — US US EXTREM LOW VENOUS*L*
1 series · 13 of 24 positions shown · non-contrast
Comparison: None.

CLINICAL DATA: 46-year-old with left thigh pain.



[Series 1: us extrem low venous*left* · 0.08mm/px · 13 of 27 slices shown]
[im 1/27]
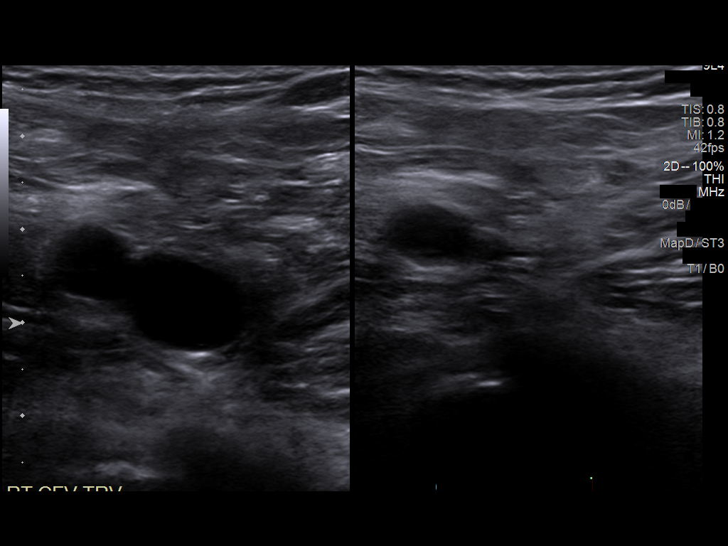
[im 3/27]
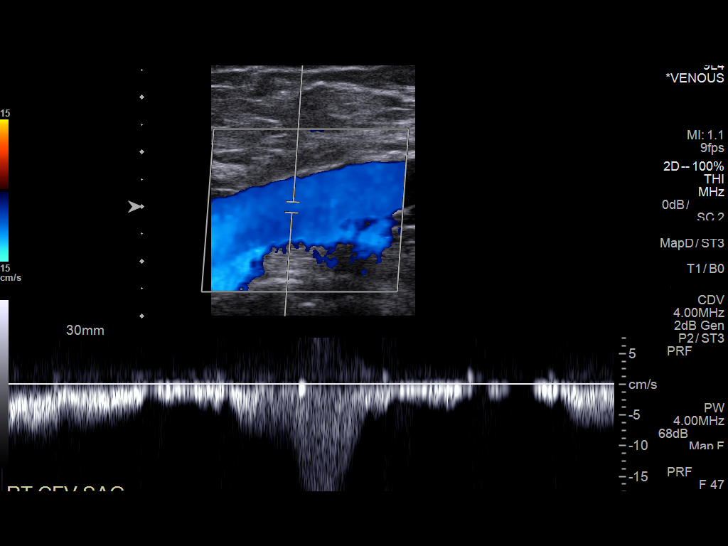
[im 5/27]
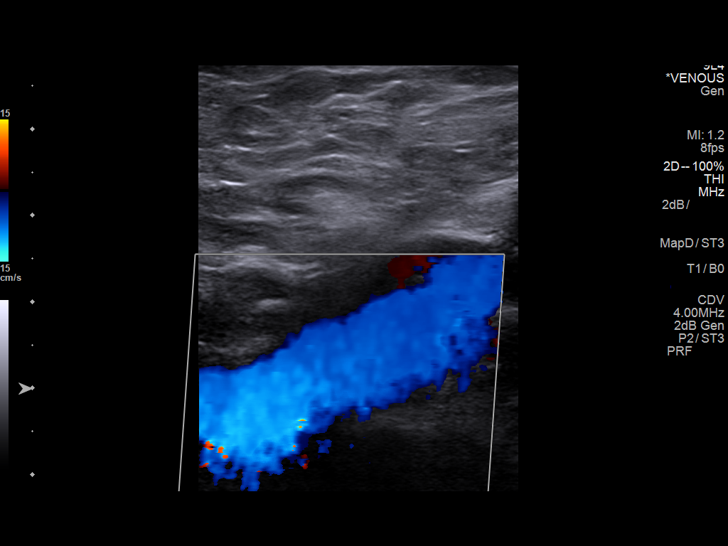
[im 7/27]
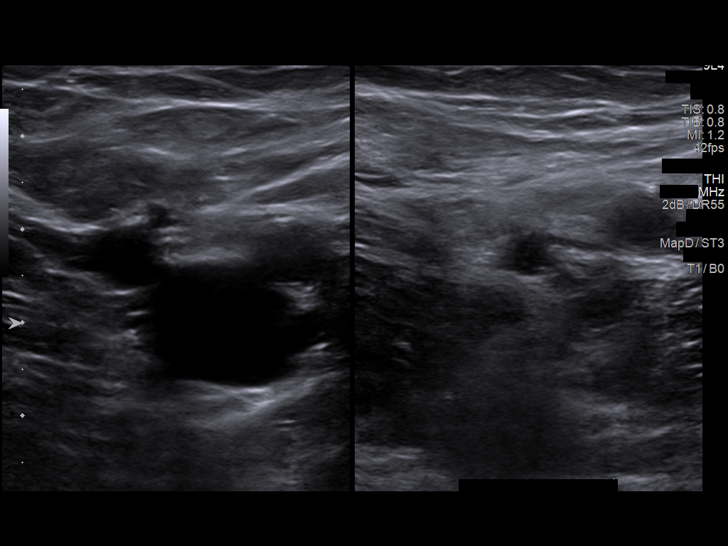
[im 10/27]
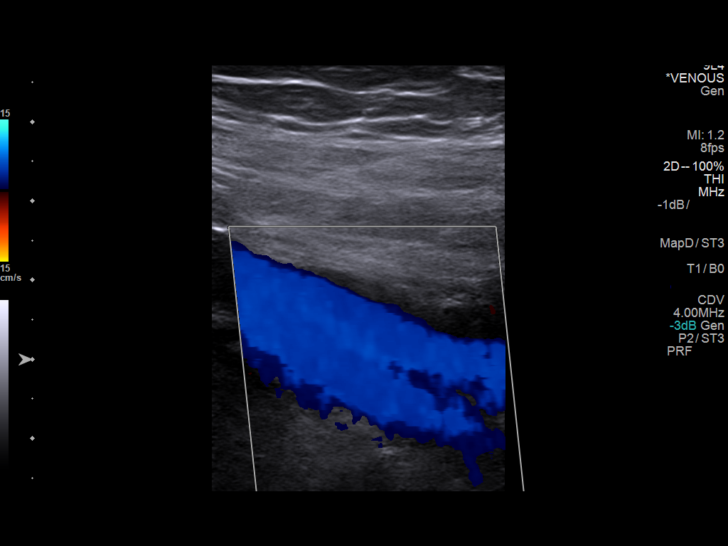
[im 12/27]
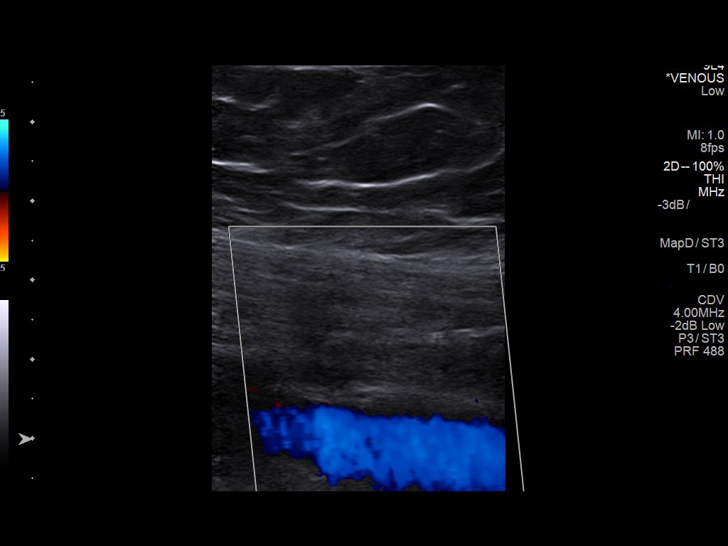
[im 14/27]
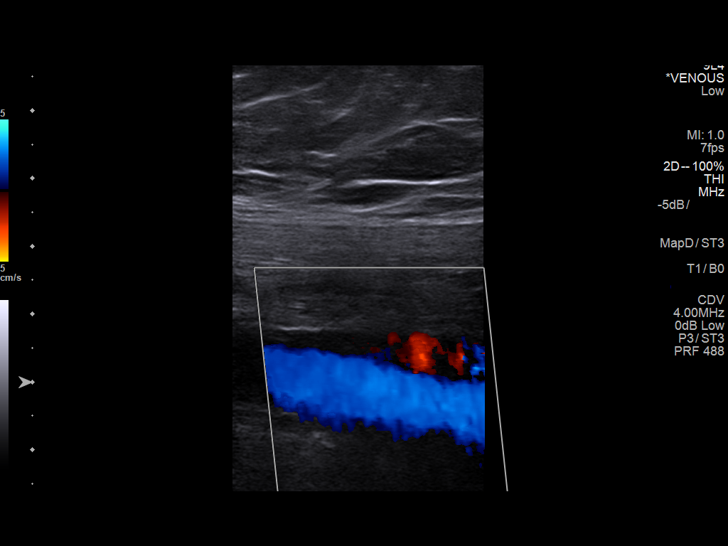
[im 15/27]
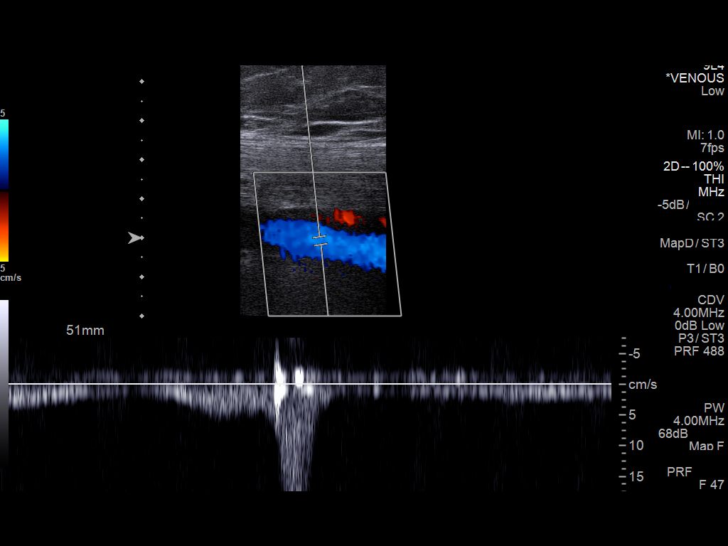
[im 17/27]
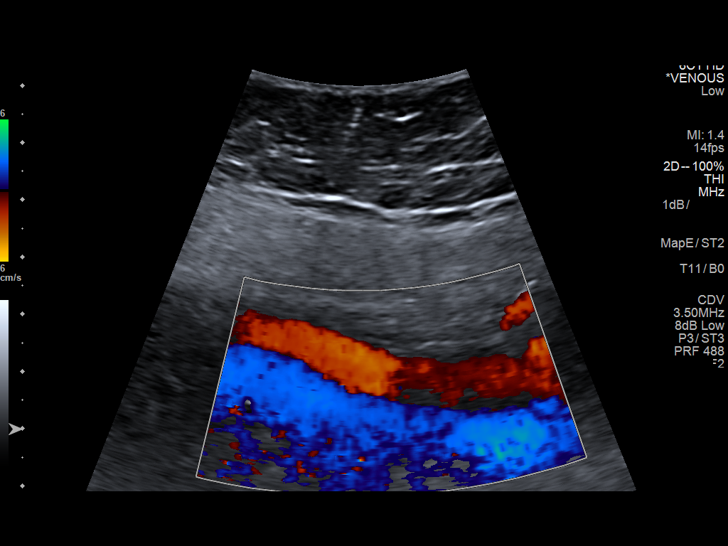
[im 20/27]
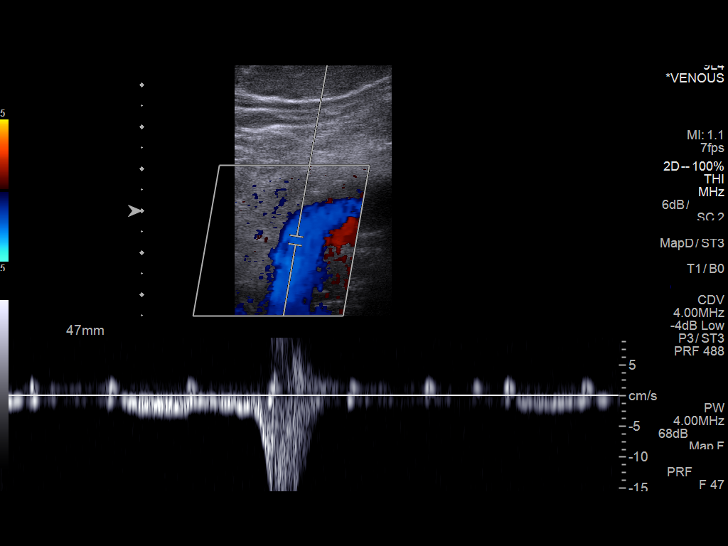
[im 22/27]
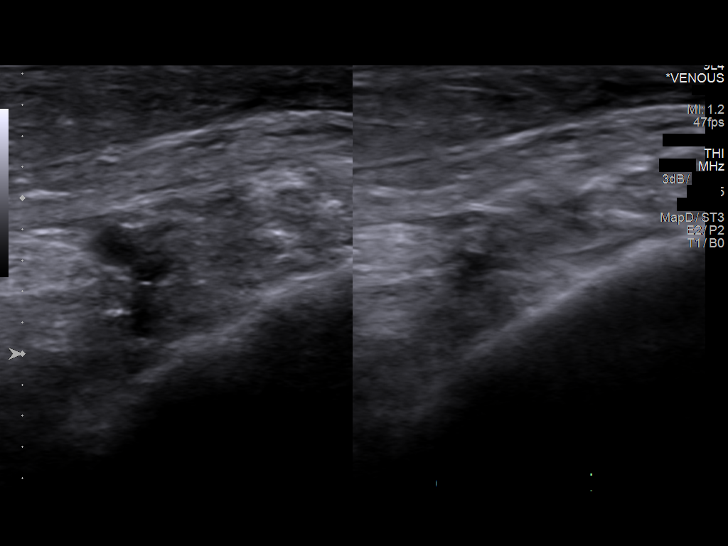
[im 24/27]
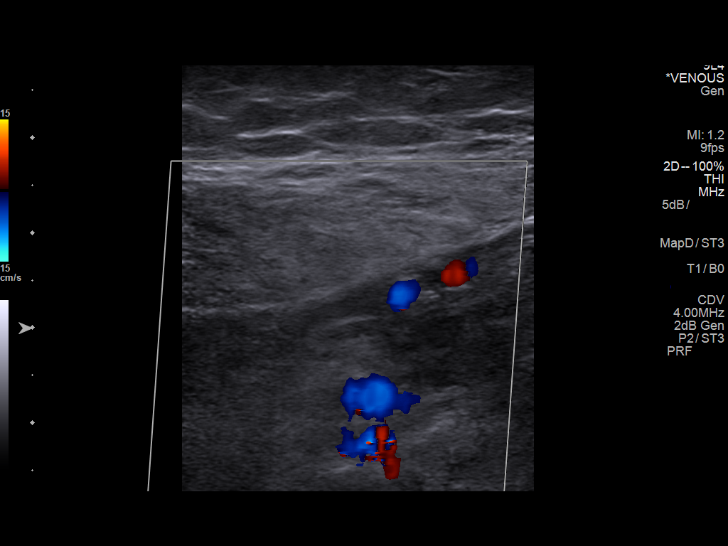
[im 27/27]
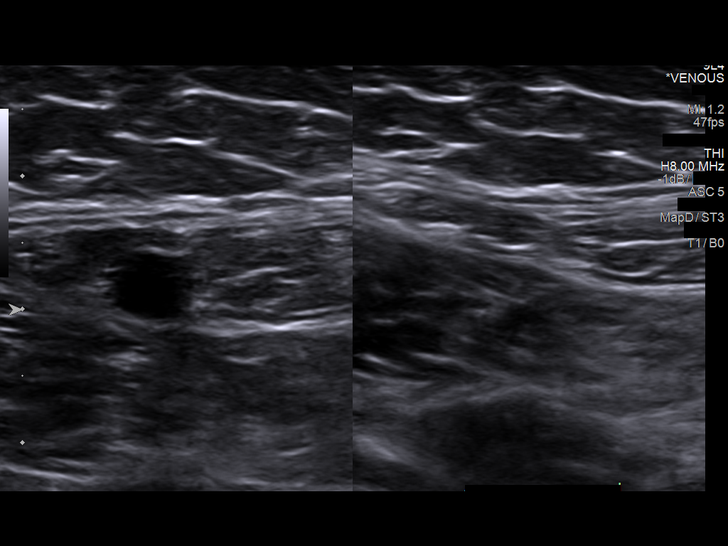

[13 of 24 positions shown; findings below may reference images not displayed]

FINDINGS: Contralateral Common Femoral Vein: Respiratory phasicity is normal
and symmetric with the symptomatic side. No evidence of thrombus.
Normal compressibility.

Common Femoral Vein: No evidence of thrombus. Normal
compressibility, respiratory phasicity and response to augmentation.

Saphenofemoral Junction: No evidence of thrombus. Normal
compressibility and flow on color Doppler imaging.

Profunda Femoral Vein: No evidence of thrombus. Normal
compressibility and flow on color Doppler imaging.

Femoral Vein: No evidence of thrombus. Normal compressibility,
respiratory phasicity and response to augmentation.

Popliteal Vein: No evidence of thrombus. Normal compressibility,
respiratory phasicity and response to augmentation.

Calf Veins: No evidence of thrombus. Normal compressibility and flow
on color Doppler imaging.

Superficial Great Saphenous Vein: No evidence of thrombus. Normal
compressibility.

Other Findings:  None.
IMPRESSION: Negative for deep venous thrombosis in left lower extremity.

## 2019-11-21 ENCOUNTER — Ambulatory Visit (HOSPITAL_COMMUNITY)
Admission: EM | Admit: 2019-11-21 | Discharge: 2019-11-21 | Disposition: A | Discharge status: Home / Self Care | Payer: 59 | Attending: Family Medicine | Admitting: Family Medicine

## 2019-11-21 ENCOUNTER — Encounter (HOSPITAL_COMMUNITY): Payer: Self-pay | Admitting: Emergency Medicine

## 2019-11-21 ENCOUNTER — Ambulatory Visit (INDEPENDENT_AMBULATORY_CARE_PROVIDER_SITE_OTHER): Payer: 59

## 2019-11-21 ENCOUNTER — Other Ambulatory Visit: Payer: Self-pay

## 2019-11-21 DIAGNOSIS — R221 Localized swelling, mass and lump, neck: Secondary | ICD-10-CM

## 2019-11-21 DIAGNOSIS — I10 Essential (primary) hypertension: Secondary | ICD-10-CM

## 2019-11-21 DIAGNOSIS — R05 Cough: Secondary | ICD-10-CM

## 2019-11-21 DIAGNOSIS — R059 Cough, unspecified: Secondary | ICD-10-CM

## 2019-11-21 MED ORDER — FUROSEMIDE 20 MG PO TABS: ORAL_TABLET | ORAL | 0 refills | Status: DC

## 2019-11-21 MED ORDER — DOXYCYCLINE HYCLATE 100 MG PO CAPS: 100.0000 mg | ORAL_CAPSULE | Freq: Two times a day (BID) | ORAL | 0 refills | Status: DC

## 2019-11-21 MED ORDER — HYDROCHLOROTHIAZIDE 25 MG PO TABS: 25.0000 mg | ORAL_TABLET | Freq: Every day | ORAL | 0 refills | Status: DC

## 2019-11-21 NOTE — Discharge Instructions (Addendum)
Your blood pressure was noted to be elevated during your visit today. If you are currently taking medication for high blood pressure, please ensure you are taking this as directed. If you do not have a history of high blood pressure and your blood pressure remains persistently elevated, you may need to begin taking a medication at some point. You may return here within the next few days to recheck if unable to see your primary care provider or if do not have a one.  BP (!) 186/117 Comment: History of HTN, has not been on meds for years, states they didn't help  Pulse 87   Temp 98.7 F (37.1 C) (Oral)   Resp 16   LMP 11/14/2019   SpO2 97%   Swelling happens when fluid collects in small spaces around tissues and organs inside the body. Another word for swelling is "edema." Some common parts of the body where people can have swelling are the lower legs or hands. This typically is worse in the areas of the body that are closest to the ground (because of gravity)  Symptoms of swelling can include puffiness of the skin, which can cause the skin to look stretched and shiny. This often occurs with swelling in the lower legs and can be worse after you sit or stand for a long time.  Treatment of edema includes several components: treatment of the underlying cause (if possible), reducing the amount of salt (sodium) in your diet, and, in many cases, use of a medication called a diuretic to eliminate excess fluid. Using compression stockings and elevating the legs may also be recommended.

## 2019-11-21 NOTE — ED Provider Notes (Signed)
[facility]   [id number] 11/21/19 Arrival Time: 1128  ASSESSMENT & PLAN:  1. Cough   2. Elevated blood pressure reading in office with diagnosis of hypertension   3. Localized swelling, mass and lump, neck     I have personally viewed the imaging studies ordered this visit. CXR: Bilateral infiltrates. Discussed findings with her..  Should neck swelling/lump return I recommend ENT evaluation.   Plans visit with PCP to discuss BP management. No s/s of hypertensive urgency. . Meds ordered this encounter  Medications  .     . doxycycline (VIBRAMYCIN) 100 MG capsule    Sig: Take 1 capsule (100 mg total) by mouth 2 (two) times daily.    Dispense:  20 capsule    Refill:  0  . hydrochlorothiazide (HYDRODIURIL) 25 MG tablet    Sig: Take 1 tablet (25 mg total) by mouth daily.    Dispense:  30 tablet    Refill:  0   Declines Lasix and would like HCTZ instead. Will cover with doxycycline for CXR findings.   Reviewed expectations re: course of current medical issues. Questions answered. Outlined signs and symptoms indicating need for more acute intervention. Understanding verbalized. After Visit Summary given.   SUBJECTIVE: History from: patient. Sherry George is a 49 y.o. female who reports URI symptoms approx 3 w ago. Concerned over persistent coughing. No SOB reported. Afebrile. Non-smoker. Known COVID-19 contact: none. Recent travel: none. Denies: sore throat and headache. Normal PO intake without n/v/d.   Increased blood pressure noted today. Reports that she has been treated for hypertension in the past. Not currently taking medication. Plans to schedule f/u with PCP to discuss. Would like something for her LE edema; painful at times. Reports no chest pain on exertion, no dyspnea on exertion, no orthostatic dizziness or lightheadedness, no orthopnea or paroxysmal nocturnal dyspnea and no palpitations.  Also reports "neck swelling". L-sided. Present a few days ago  but better now. Questions enlarged lymph node. H/O similar.   OBJECTIVE:  Vitals:   11/21/19 1342  BP: (!) 186/117  Pulse: 87  Resp: 16  Temp: 98.7 F (37.1 C)  TempSrc: Oral  SpO2: 97%    General appearance: alert; no distress Eyes: PERRLA; EOMI; conjunctiva normal HENT: [place]; AT; nasal mucosa normal; oral mucosa normal Neck: supple  Lungs: speaks full sentences without difficulty; unlabored; dry cough at times; no wheezing Extremities: no edema Skin: warm and dry Neurologic: normal gait Psychological: alert and cooperative; normal mood and affect    Allergies  Allergen Reactions  . Bactrim Other (See Comments)    Made her sick and fever  . Morphine And Related Itching  . Penicillins Itching    Past Medical History:  Diagnosis Date  . Hypertension   . MRSA infection   . Sickle cell anemia (HCC)    Social History   Socioeconomic History  . Marital status: Single    Spouse name: Not on file  . Number of children: Not on file  . Years of education: Not on file  . Highest education level: Not on file  Occupational History  . Not on file  Tobacco Use  . Smoking status: Former Smoker    Quit date: 02/19/2012    Years since quitting: 7.7  . Smokeless tobacco: Never Used  Substance and Sexual Activity  . Alcohol use: Yes    Alcohol/week: 2.0 standard drinks    Types: 1 Glasses of wine, 1 Cans of beer per week  . Drug  use: No  . Sexual activity: Not on file  Other Topics Concern  . Not on file  Social History Narrative  . Not on file   Social Determinants of Health   Financial Resource Strain:   . Difficulty of Paying Living Expenses: Not on file  Food Insecurity:   . Worried About Programme researcher, broadcasting/film/video in the Last Year: Not on file  . Ran Out of Food in the Last Year: Not on file  Transportation Needs:   . Lack of Transportation (Medical): Not on file  . Lack of Transportation (Non-Medical): Not on file  Physical Activity:   . Days of Exercise per  Week: Not on file  . Minutes of Exercise per Session: Not on file  Stress:   . Feeling of Stress : Not on file  Social Connections:   . Frequency of Communication with Friends and Family: Not on file  . Frequency of Social Gatherings with Friends and Family: Not on file  . Attends Religious Services: Not on file  . Active Member of Clubs or Organizations: Not on file  . Attends Banker Meetings: Not on file  . Marital Status: Not on file  Intimate Partner Violence:   . Fear of Current or Ex-Partner: Not on file  . Emotionally Abused: Not on file  . Physically Abused: Not on file  . Sexually Abused: Not on file   Family History  Problem Relation Age of Onset  . Cancer Mother        colon  . Diabetes Mother   . Heart disease Mother   . Hypertension Mother   . Heart disease Father   . Cancer Sister        colon  . Sickle cell trait Brother    Past Surgical History:  Procedure Laterality Date  . COLONOSCOPY    . ECTOPIC PREGNANCY SURGERY    . etopic    . EYE SURGERY    . Corbin Ade, MD 11/21/19 [phone]

## 2019-11-21 NOTE — ED Triage Notes (Signed)
PT reports cough for 3 weeks. She has assumed she has had COVID. She has been coughing for weeks. Now complains of continued cough. Also complains of neck swelling and pain.

## 2020-01-21 ENCOUNTER — Encounter (HOSPITAL_COMMUNITY): Payer: Self-pay

## 2020-01-21 ENCOUNTER — Other Ambulatory Visit: Payer: Self-pay

## 2020-01-21 ENCOUNTER — Ambulatory Visit (HOSPITAL_COMMUNITY)
Admission: EM | Admit: 2020-01-21 | Discharge: 2020-01-21 | Disposition: A | Discharge status: Home / Self Care | Payer: 59

## 2020-01-21 DIAGNOSIS — I10 Essential (primary) hypertension: Secondary | ICD-10-CM

## 2020-01-21 DIAGNOSIS — R030 Elevated blood-pressure reading, without diagnosis of hypertension: Secondary | ICD-10-CM

## 2020-01-21 MED ORDER — FUROSEMIDE 20 MG PO TABS: 20.0000 mg | ORAL_TABLET | Freq: Every day | ORAL | 0 refills | Status: AC

## 2020-01-21 NOTE — ED Triage Notes (Signed)
Pt states she ran out of her HTN medications several days ago and doesn't have an appt with a PCP until the end of November.  Furosemide 20mg , 1-2 tablets every day. Is not taking potassium replacement medication currently.

## 2020-01-21 NOTE — ED Provider Notes (Signed)
[facility]   MRN: [id number] DOB: 1970-09-08  Subjective:   Sherry George is a 49 y.o. female presenting for medication refill of her furosemide.  Patient has uncontrolled hypertension.  She has an appointment with her PCP at the end of November.  Admits that she does not eat a hypertensive friendly diet consistently.  Denies fever, headache, confusion, chest pain, shortness of breath, belly pain, hematuria, lower leg swelling.  No current facility-administered medications for this encounter.  Current Outpatient Medications:  .  BLACK CURRANT SEED OIL PO, Take by mouth., Disp: , Rfl:  .  doxycycline (VIBRAMYCIN) 100 MG capsule, Take 1 capsule (100 mg total) by mouth 2 (two) times daily., Disp: 20 capsule, Rfl: 0 .  ferrous sulfate 325 (65 FE) MG EC tablet, Take 325 mg by mouth 3 (three) times daily with meals., Disp: , Rfl:  .  hydrochlorothiazide (HYDRODIURIL) 25 MG tablet, Take 1 tablet (25 mg total) by mouth daily., Disp: 30 tablet, Rfl: 0 .  HYDROcodone-acetaminophen (NORCO) 5-325 MG per tablet, Take 1 tablet by mouth every 6 (six) hours as needed for moderate pain., Disp: 10 tablet, Rfl: 0 .  ibuprofen (ADVIL,MOTRIN) 600 MG tablet, Take 1 tablet (600 mg total) by mouth every 6 (six) hours as needed., Disp: 30 tablet, Rfl: 0 .  Krill Oil Omega-3 300 MG CAPS, Take 1 capsule by mouth daily., Disp: , Rfl:  .  methocarbamol (ROBAXIN) 500 MG tablet, Take 1 tablet (500 mg total) by mouth 2 (two) times daily., Disp: 20 tablet, Rfl: 0 .  Multiple Vitamins-Minerals (WOMENS DAILY FORMULA) TABS, Take 1 tablet by mouth daily., Disp: , Rfl:    Allergies  Allergen Reactions  . Bactrim Other (See Comments)    Made her sick and fever  . Morphine And Related Itching  . Penicillins Itching    Past Medical History:  Diagnosis Date  . Hypertension   . MRSA infection   . Sickle cell anemia (HCC)      Past Surgical History:  Procedure Laterality Date  . COLONOSCOPY    .  ECTOPIC PREGNANCY SURGERY    . etopic    . EYE SURGERY    . swart      Family History  Problem Relation Age of Onset  . Cancer Mother        colon  . Diabetes Mother   . Heart disease Mother   . Hypertension Mother   . Heart disease Father   . Cancer Sister        colon  . Sickle cell trait Brother     Social History   Tobacco Use  . Smoking status: Former Smoker    Quit date: 02/19/2012    Years since quitting: 7.9  . Smokeless tobacco: Never Used  Substance Use Topics  . Alcohol use: Yes    Alcohol/week: 2.0 standard drinks    Types: 1 Glasses of wine, 1 Cans of beer per week  . Drug use: No    ROS   Objective:   Vitals: BP (!) 179/101 (BP Location: Right Arm)   Pulse 81   Temp 99.1 F (37.3 C) (Oral)   Resp 16   LMP 12/19/2019 (Approximate)   SpO2 99%   Physical Exam Constitutional:      General: She is not in acute distress.    Appearance: Normal appearance. She is well-developed. She is not ill-appearing, toxic-appearing or diaphoretic.  HENT:     Head: Normocephalic and atraumatic.  Nose: Nose normal.     Mouth/Throat:     Mouth: Mucous membranes are moist.  Eyes:     Extraocular Movements: Extraocular movements intact.     Pupils: Pupils are equal, round, and reactive to light.  Cardiovascular:     Rate and Rhythm: Normal rate and regular rhythm.     Pulses: Normal pulses.     Heart sounds: Normal heart sounds. No murmur heard.  No friction rub. No gallop.   Pulmonary:     Effort: Pulmonary effort is normal. No respiratory distress.     Breath sounds: Normal breath sounds. No stridor. No wheezing, rhonchi or rales.  Skin:    General: Skin is warm and dry.     Findings: No rash.  Neurological:     Mental Status: She is alert and oriented to person, place, and time.     Cranial Nerves: No cranial nerve deficit.     Motor: No weakness.     Coordination: Coordination normal.     Gait: Gait normal.     Deep Tendon Reflexes: Reflexes  normal.     Comments: Negative Romberg and pronator drift.  Psychiatric:        Mood and Affect: Mood normal.        Behavior: Behavior normal.        Thought Content: Thought content normal.        Judgment: Judgment normal.      Assessment and Plan :   PDMP not reviewed this encounter.  1. Essential hypertension   2. Elevated blood pressure reading     No signs of intracranial process, stroke.  Recommended dietary modifications.  Refilled her Lasix for 30 days.  Emphasized need to eat potassium rich foods and maintain follow-up with PCP. Counseled patient on potential for adverse effects with medications prescribed/recommended today, ER and return-to-clinic precautions discussed, patient verbalized understanding.    Wallis Bamberg, [place] 01/21/20 1907

## 2020-01-21 NOTE — Discharge Instructions (Addendum)
For diabetes or elevated blood sugar, please make sure you are avoiding starchy, carbohydrate foods like pasta, breads, pastry, rice, potatoes, desserts. These foods can elevated your blood sugar. Also, avoid sodas, sweet teas, sugary beverages, fruit juices.  Drinking plain water will be much more helpful, try 64 ounces of water daily.  It is okay to flavor your water naturally by cutting cucumber, lemon, mint or lime, placing it in a picture with water and drinking it over a period of 2 to 3 days as long as it remains refrigerated.    For elevated blood pressure, make sure you are monitoring salt in your diet.  Do not eat restaurant foods and limit processed foods at home, prepare/cook your own foods at home.  Processed foods include things like frozen meals preseasoned meats and dinners, deli meats, canned foods as they are high in sodium/salt.  Make sure your pain attention to sodium labels on foods you by at the grocery store.  For seasoning you can use a brand called Mrs. Dash which includes a lot of salt free seasonings.  Salads - kale, spinach, cabbage, spring mix; use seeds like pumpkin seeds or sunflower seeds, almonds, walnuts or pecans; you can also use 1-2 hard boiled eggs in your salads Fruits - avocadoes, berries (blueberries, raspberries, blackberries), apples, oranges, pomegranate, pear; avoid eating bananas, grapes regularly Vegetables - aspargus, cauliflower, broccoli, green beans, brussel spouts, bell peppers; stay away from starchy vegetables like potatoes, carrots, peas  Regarding meat it is better to eat lean meats and limit your red meat including pork to once a week.  Wild caught fish, chicken breast are good options as they tend to be leaner sources of good protein.   DO NOT EAT ANY FOODS ON THIS LIST THAT YOU ARE ALLERGIC TO.   Eat more foods that contain a lot of potassium. This includes: Nuts, such as peanuts and pistachios. Seeds, such as sunflower seeds and pumpkin  seeds. Peas, lentils, and lima beans. Whole grain and bran cereals and breads. Fresh fruits and vegetables, such as apricots, avocado, bananas, cantaloupe, kiwi, oranges, tomatoes, asparagus, and potatoes. Orange juice. Tomato juice. Red meats. Yogurt.

## 2020-02-17 DIAGNOSIS — Z91199 Patient's noncompliance with other medical treatment and regimen due to unspecified reason: Secondary | ICD-10-CM | POA: Insufficient documentation

## 2020-02-17 DIAGNOSIS — Z9119 Patient's noncompliance with other medical treatment and regimen: Secondary | ICD-10-CM | POA: Insufficient documentation

## 2020-03-25 ENCOUNTER — Other Ambulatory Visit: Payer: Self-pay

## 2020-03-25 ENCOUNTER — Encounter: Payer: Self-pay | Admitting: Obstetrics & Gynecology

## 2020-03-25 ENCOUNTER — Ambulatory Visit (INDEPENDENT_AMBULATORY_CARE_PROVIDER_SITE_OTHER): Payer: 59 | Admitting: Obstetrics & Gynecology

## 2020-03-25 ENCOUNTER — Other Ambulatory Visit (HOSPITAL_COMMUNITY)
Admission: RE | Admit: 2020-03-25 | Discharge: 2020-03-25 | Disposition: A | Discharge status: Home / Self Care | Payer: 59 | Source: Ambulatory Visit | Attending: Obstetrics & Gynecology | Admitting: Obstetrics & Gynecology

## 2020-03-25 VITALS — BP 155/99 | HR 77 | Ht 67.0 in | Wt 235.0 lb

## 2020-03-25 DIAGNOSIS — Z01419 Encounter for gynecological examination (general) (routine) without abnormal findings: Secondary | ICD-10-CM

## 2020-03-25 DIAGNOSIS — N951 Menopausal and female climacteric states: Secondary | ICD-10-CM

## 2020-03-25 DIAGNOSIS — I10 Essential (primary) hypertension: Secondary | ICD-10-CM | POA: Insufficient documentation

## 2020-03-25 DIAGNOSIS — Z124 Encounter for screening for malignant neoplasm of cervix: Secondary | ICD-10-CM

## 2020-03-25 DIAGNOSIS — D571 Sickle-cell disease without crisis: Secondary | ICD-10-CM

## 2020-03-25 NOTE — Progress Notes (Signed)
50 y.o. G44P1011 Single Black or [place] American female here for annual exam.  Hasn't had one in several years.  Menstrual cycles have changed in the last year.  She has skipped a few months.  She's had 40+ days between cycles this year.  Flow has lightened and there has been a little more spotting.  Typical flow lasts 7 days.  Flow is typically heavy for the first few days.  Does not past clots.  She does have increased cramping in the past two years.   Declines mammogram.            Sexually active: No.  The current method of family planning is abstinence.    Smoker:  no  Health Maintenance: Pap:  Obtained History of abnormal Pap:  Yes.  Has done a colposcopy in the past.   MMG:  Declines.   Colonoscopy:  Pt reports she's had this done in the past. Declines telling me today where this was done.     reports that she quit smoking about 8 years ago. She has never used smokeless tobacco. She reports previous alcohol use of about 2.0 standard drinks of alcohol per week. She reports that she does not use drugs.  Past Medical History:  Diagnosis Date  . Hypertension   . MRSA infection   . Sickle cell anemia (HCC)   . Sickle cell disease (HCC)    last crisis was 2011    Past Surgical History:  Procedure Laterality Date  . COLONOSCOPY    . ECTOPIC PREGNANCY SURGERY    . etopic    . EYE SURGERY    . swart      Current Outpatient Medications  Medication Sig Dispense Refill  . Ascorbic Acid (VITAMIN C) 100 MG tablet Take 100 mg by mouth daily.    Marland Kitchen BLACK CURRANT SEED OIL PO Take by mouth.    . ferrous sulfate 325 (65 FE) MG EC tablet Take 325 mg by mouth 3 (three) times daily with meals.    . furosemide (LASIX) 20 MG tablet Take 1 tablet (20 mg total) by mouth daily. 30 tablet 0  . Krill Oil Omega-3 300 MG CAPS Take 1 capsule by mouth daily.    . Multiple Vitamins-Minerals (WOMENS DAILY FORMULA) TABS Take 1 tablet by mouth daily.    . vitamin B-12 (CYANOCOBALAMIN) 100 MCG tablet Take  100 mcg by mouth daily.    Marland Kitchen doxycycline (VIBRAMYCIN) 100 MG capsule Take 1 capsule (100 mg total) by mouth 2 (two) times daily. (Patient not taking: Reported on 03/25/2020) 20 capsule 0  . hydrochlorothiazide (HYDRODIURIL) 25 MG tablet Take 1 tablet (25 mg total) by mouth daily. (Patient not taking: Reported on 03/25/2020) 30 tablet 0  . HYDROcodone-acetaminophen (NORCO) 5-325 MG per tablet Take 1 tablet by mouth every 6 (six) hours as needed for moderate pain. (Patient not taking: Reported on 03/25/2020) 10 tablet 0  . ibuprofen (ADVIL,MOTRIN) 600 MG tablet Take 1 tablet (600 mg total) by mouth every 6 (six) hours as needed. (Patient not taking: Reported on 03/25/2020) 30 tablet 0  . methocarbamol (ROBAXIN) 500 MG tablet Take 1 tablet (500 mg total) by mouth 2 (two) times daily. (Patient not taking: Reported on 03/25/2020) 20 tablet 0   No current facility-administered medications for this visit.    Family History  Problem Relation Age of Onset  . Cancer Mother        colon  . Diabetes Mother   . Heart disease Mother   .  Hypertension Mother   . Heart disease Father   . Cancer Sister        colon  . Sickle cell trait Brother     Review of Systems  Exam:   BP (!) 155/99   Pulse 77   Ht 5\' 7"  (1.702 m)   Wt 235 lb (106.6 kg)   BMI 36.81 kg/m   Height: 5\' 7"  (170.2 cm)  General appearance: alert, cooperative and appears stated age Head: Normocephalic, without obvious abnormality, atraumatic Neck: no adenopathy, supple, symmetrical, trachea midline and thyroid normal to inspection and palpation Lungs: clear to auscultation bilaterally Breasts: normal appearance, no masses or tenderness Heart: regular rate and rhythm Abdomen: soft, non-tender; bowel sounds normal; no masses,  no organomegaly Extremities: extremities normal, atraumatic, no cyanosis or edema Skin: Skin color, texture, turgor normal. No rashes or lesions Lymph nodes: Cervical, supraclavicular, and axillary nodes normal. No  abnormal inguinal nodes palpated Neurologic: Grossly normal   Pelvic: External genitalia:  no lesions              Urethra:  normal appearing urethra with no masses, tenderness or lesions              Bartholins and Skenes: normal                 Vagina: normal appearing vagina with normal color and discharge, no lesions              Cervix: no lesions              Pap taken: Yes.   Bimanual Exam:  Uterus:  normal size, contour, position, consistency, mobility, non-tender              Adnexa: normal adnexa and no mass, fullness, tenderness               Rectovaginal: Confirms               Anus:  normal sphincter tone, no lesions, hemorrhoids present  Chaperone, , RN, present for exam  Assessment/Plan: 1. Well woman exam with routine gynecological exam - Declines referral for mammogram.  States she is not going to have this done. - reports she's had a colonoscopy done in the past.  States she cannot remember provider.  This is being done due to family hx of cancer with sister.  Unsure if she had colon cancer. - declines any vaccines - declines any blood work  2. Cervical cancer screening - Cytology - PAP( [place])  3. Perimenopausal - signs/symptoms.  Reasons for calling with changes in cycles discussed.  4. Primary hypertension - followed by PCP.  Reviewed recent lab work in .

## 2020-03-25 NOTE — Progress Notes (Signed)
Patient is currently being treated for hypertension just recently restarted bp meds. Patient is unsure of  Primary care doctor name or location. (In care everywhere it is [facility]) Armandina Stammer RN

## 2020-03-29 LAB — CYTOLOGY - PAP
Comment: NEGATIVE
Diagnosis: NEGATIVE
High risk HPV: NEGATIVE

## 2020-04-02 ENCOUNTER — Encounter: Payer: Self-pay | Admitting: Obstetrics & Gynecology

## 2021-03-05 IMAGING — DX DG CHEST 2V
2 series · 2 of 2 positions shown · non-contrast
Comparison: 03/18/2013

CLINICAL DATA: Cough 3 weeks

EXAM:
CHEST - 2 VIEW

[chest pa]
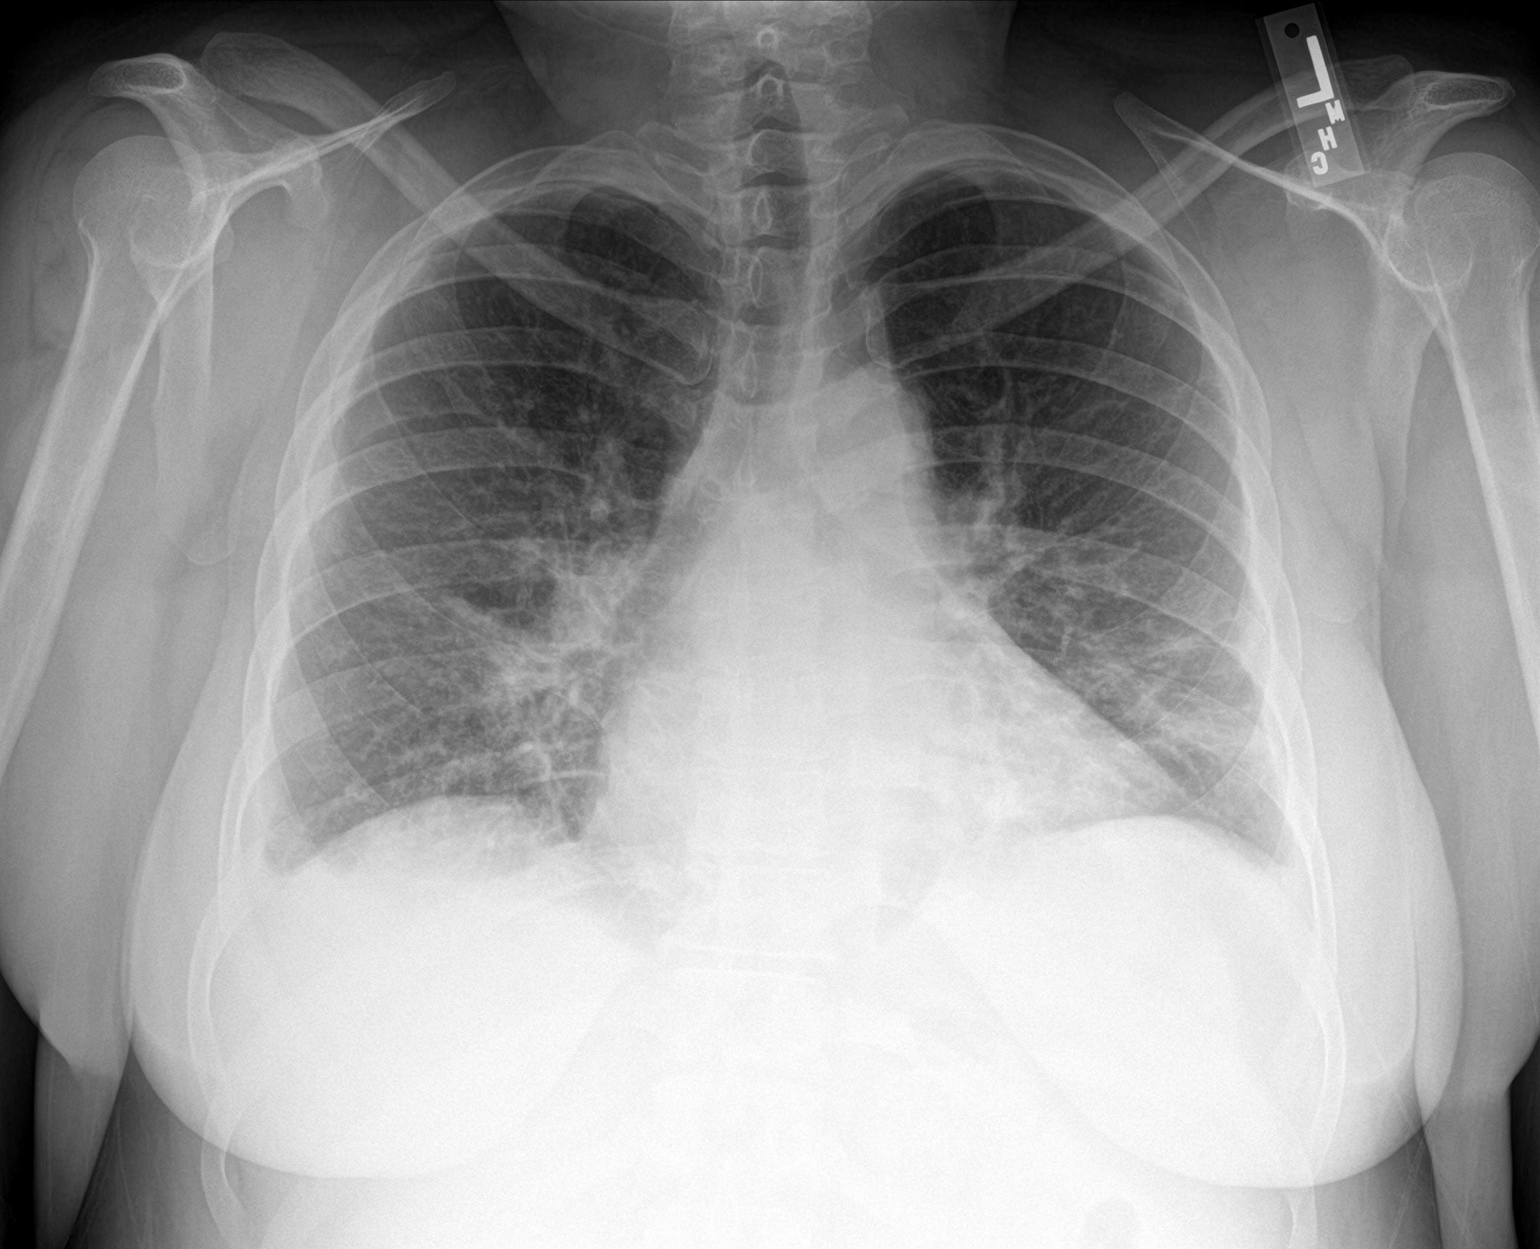

[chest lat]
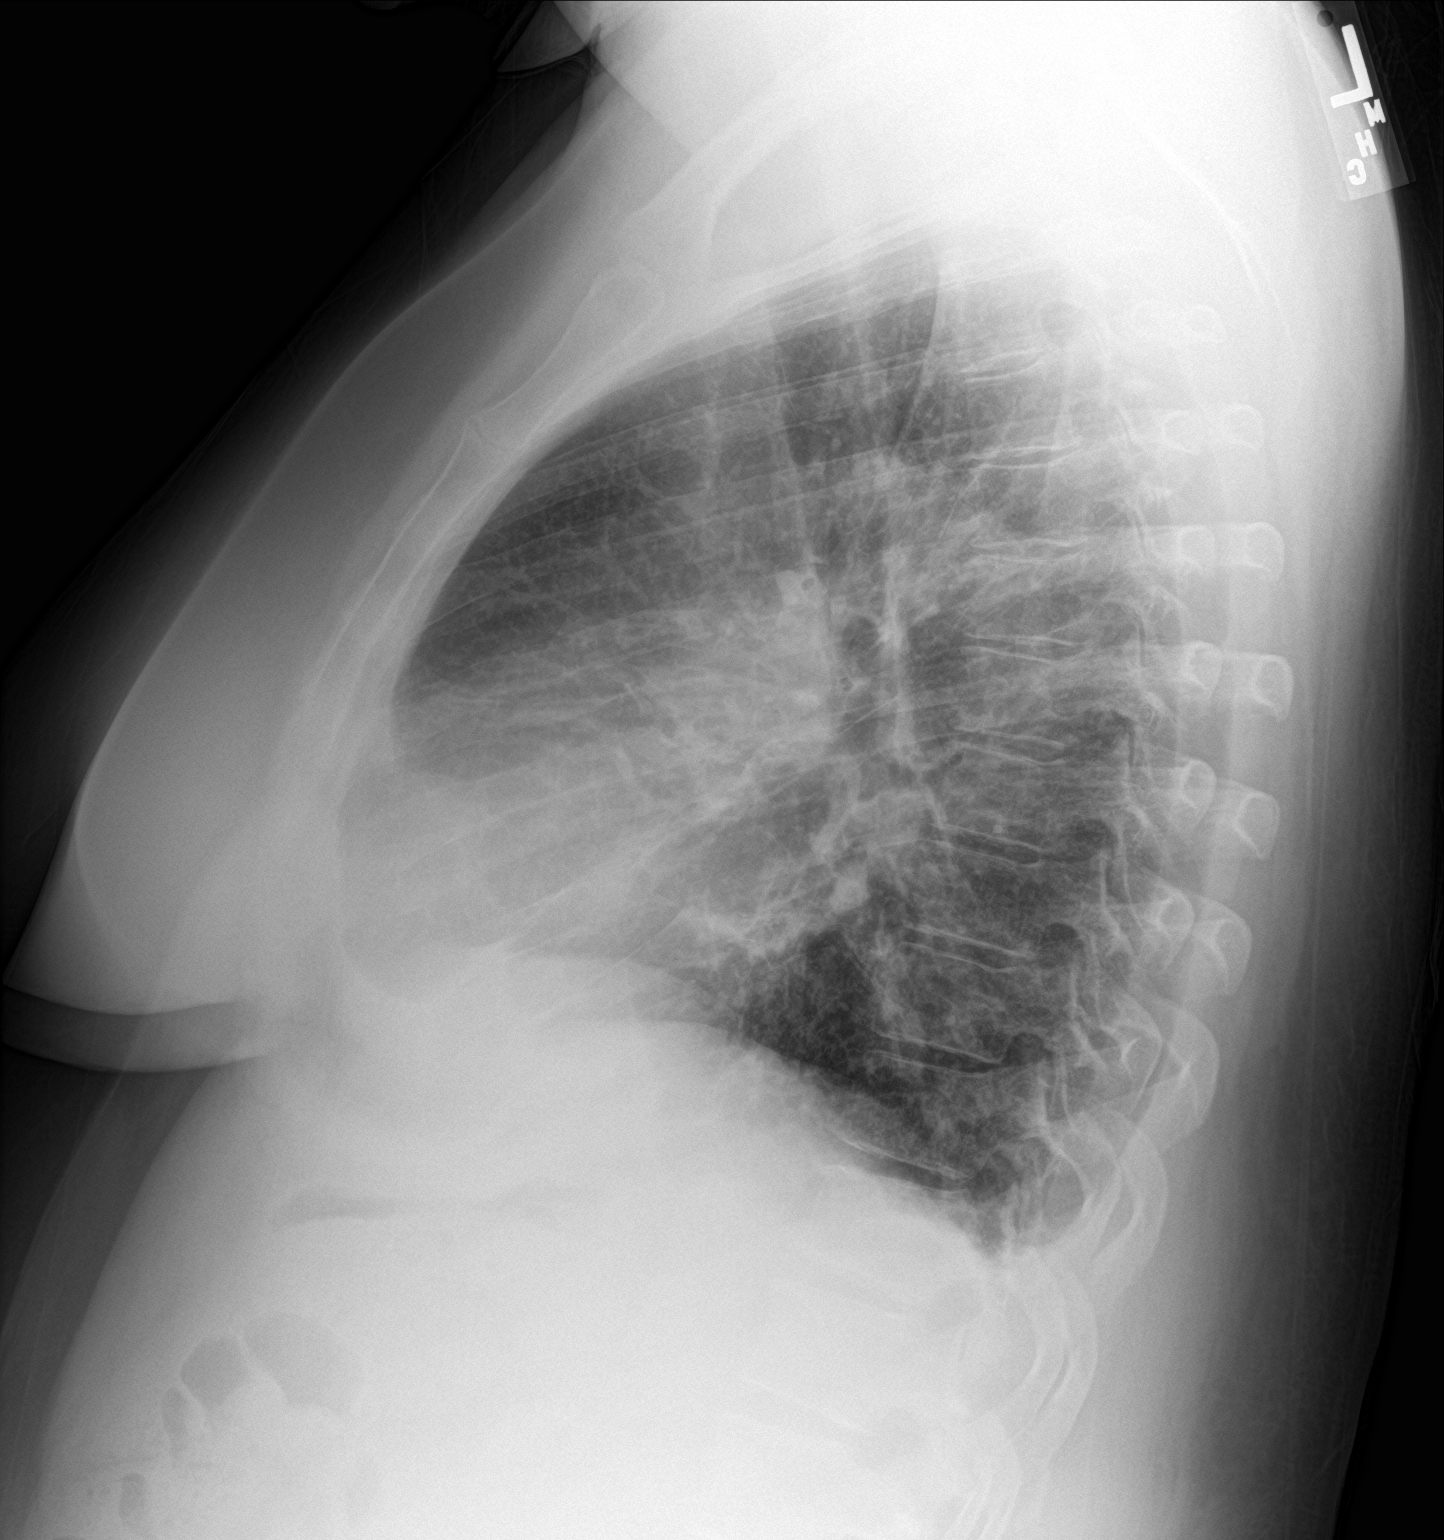

[2 of 2 positions shown; findings below may reference images not displayed]

FINDINGS: Decreased lung volume. Progression of bibasilar airspace disease
which could be atelectasis or pneumonia. Small pleural effusion
bilaterally. Negative for heart failure.
IMPRESSION: Decreased lung volume. Increase in bibasilar airspace disease
possible pneumonia. Small bilateral pleural effusions.
